# Patient Record
Sex: Male | Born: 1980
Health system: Southern US, Community
[De-identification: ages and names within clinical notes are randomized; demographics above are authoritative.]

## PROBLEM LIST (undated history)

## (undated) DIAGNOSIS — F4310 Post-traumatic stress disorder, unspecified: Secondary | ICD-10-CM

## (undated) DIAGNOSIS — F319 Bipolar disorder, unspecified: Secondary | ICD-10-CM

## (undated) DIAGNOSIS — Z720 Tobacco use: Secondary | ICD-10-CM

## (undated) HISTORY — PX: ORTHOPEDIC SURGERY: SHX850

---

## 2003-11-27 ENCOUNTER — Other Ambulatory Visit: Payer: Self-pay

## 2003-11-27 ENCOUNTER — Emergency Department: Payer: Self-pay | Admitting: Emergency Medicine

## 2004-03-12 ENCOUNTER — Emergency Department: Payer: Self-pay | Admitting: General Practice

## 2004-10-14 ENCOUNTER — Emergency Department: Payer: Self-pay | Admitting: Emergency Medicine

## 2006-10-14 ENCOUNTER — Emergency Department: Payer: Self-pay | Admitting: Unknown Physician Specialty

## 2007-08-25 ENCOUNTER — Emergency Department: Payer: Self-pay | Admitting: Emergency Medicine

## 2007-08-31 ENCOUNTER — Emergency Department: Payer: Self-pay | Admitting: Emergency Medicine

## 2009-03-15 ENCOUNTER — Emergency Department: Payer: Self-pay | Admitting: Emergency Medicine

## 2009-04-20 ENCOUNTER — Observation Stay: Payer: Self-pay | Admitting: Specialist

## 2010-09-17 ENCOUNTER — Emergency Department: Payer: Self-pay | Admitting: *Deleted

## 2011-01-04 ENCOUNTER — Emergency Department: Payer: Self-pay | Admitting: Unknown Physician Specialty

## 2012-07-06 ENCOUNTER — Emergency Department: Payer: Self-pay | Admitting: Emergency Medicine

## 2014-02-18 ENCOUNTER — Emergency Department: Payer: Self-pay | Admitting: Emergency Medicine

## 2014-02-18 LAB — COMPREHENSIVE METABOLIC PANEL
ANION GAP: 3 — AB (ref 7–16)
Albumin: 3.7 g/dL (ref 3.4–5.0)
Alkaline Phosphatase: 57 U/L (ref 46–116)
BUN: 15 mg/dL (ref 7–18)
Bilirubin,Total: 0.4 mg/dL (ref 0.2–1.0)
CALCIUM: 8.8 mg/dL (ref 8.5–10.1)
CHLORIDE: 105 mmol/L (ref 98–107)
CO2: 29 mmol/L (ref 21–32)
CREATININE: 0.9 mg/dL (ref 0.60–1.30)
EGFR (African American): >60
EGFR (Non-African Amer.): >60
GLUCOSE: 124 mg/dL — AB (ref 65–99)
OSMOLALITY: 276 (ref 275–301)
POTASSIUM: 3.9 mmol/L (ref 3.5–5.1)
SGOT(AST): 31 U/L (ref 15–37)
SGPT (ALT): 28 U/L (ref 14–63)
SODIUM: 137 mmol/L (ref 136–145)
Total Protein: 7.1 g/dL (ref 6.4–8.2)

## 2014-02-18 LAB — CBC WITH DIFFERENTIAL/PLATELET
BASOS ABS: 0.1 10*3/uL (ref 0.0–0.1)
BASOS PCT: 0.9 %
Eosinophil #: 0.1 10*3/uL (ref 0.0–0.7)
Eosinophil %: 0.9 %
HCT: 45.4 % (ref 40.0–52.0)
HGB: 14.9 g/dL (ref 13.0–18.0)
LYMPHS ABS: 2.9 10*3/uL (ref 1.0–3.6)
Lymphocyte %: 19 %
MCH: 30.4 pg (ref 26.0–34.0)
MCHC: 32.9 g/dL (ref 32.0–36.0)
MCV: 92 fL (ref 80–100)
MONO ABS: 1.2 x10 3/mm — AB (ref 0.2–1.0)
Monocyte %: 7.7 %
NEUTROS ABS: 10.8 10*3/uL — AB (ref 1.4–6.5)
NEUTROS PCT: 71.5 %
PLATELETS: 196 10*3/uL (ref 150–440)
RBC: 4.92 10*6/uL (ref 4.40–5.90)
RDW: 13.9 % (ref 11.5–14.5)
WBC: 15.1 10*3/uL — AB (ref 3.8–10.6)

## 2014-02-18 LAB — LIPASE, BLOOD: Lipase: 156 U/L (ref 73–393)

## 2014-02-18 LAB — TROPONIN I: Troponin-I: <0.02 ng/mL

## 2014-02-23 ENCOUNTER — Emergency Department: Payer: Self-pay | Admitting: Emergency Medicine

## 2016-02-23 ENCOUNTER — Emergency Department
Admission: EM | Admit: 2016-02-23 | Discharge: 2016-02-24 | Disposition: A | Discharge status: Home / Self Care | Payer: Medicare Other | Attending: Emergency Medicine | Admitting: Emergency Medicine

## 2016-02-23 DIAGNOSIS — N200 Calculus of kidney: Secondary | ICD-10-CM

## 2016-02-23 DIAGNOSIS — R109 Unspecified abdominal pain: Secondary | ICD-10-CM | POA: Diagnosis present

## 2016-02-23 LAB — BASIC METABOLIC PANEL
Anion gap: 10 (ref 5–15)
BUN: 13 mg/dL (ref 6–20)
CHLORIDE: 103 mmol/L (ref 101–111)
CO2: 24 mmol/L (ref 22–32)
Calcium: 9 mg/dL (ref 8.9–10.3)
Creatinine, Ser: 0.91 mg/dL (ref 0.61–1.24)
GFR calc Af Amer: >60 mL/min (ref >60)
GFR calc non Af Amer: >60 mL/min (ref >60)
GLUCOSE: 146 mg/dL — AB (ref 65–99)
POTASSIUM: 4.1 mmol/L (ref 3.5–5.1)
Sodium: 137 mmol/L (ref 135–145)

## 2016-02-23 LAB — CBC
HEMATOCRIT: 44.1 % (ref 40.0–52.0)
Hemoglobin: 15.4 g/dL (ref 13.0–18.0)
MCH: 31.2 pg (ref 26.0–34.0)
MCHC: 34.9 g/dL (ref 32.0–36.0)
MCV: 89.3 fL (ref 80.0–100.0)
Platelets: 222 10*3/uL (ref 150–440)
RBC: 4.94 MIL/uL (ref 4.40–5.90)
RDW: 13.8 % (ref 11.5–14.5)
WBC: 16.8 10*3/uL — ABNORMAL HIGH (ref 3.8–10.6)

## 2016-02-23 MED ORDER — SODIUM CHLORIDE 0.9 % IV BOLUS (SEPSIS)
1000.0000 mL | Freq: Once | INTRAVENOUS | Status: AC
  Administered 2016-02-23: 1000 mL via INTRAVENOUS

## 2016-02-23 MED ORDER — MORPHINE SULFATE (PF) 4 MG/ML IV SOLN
4.0000 mg | Freq: Once | INTRAVENOUS | Status: AC
  Administered 2016-02-23: 4 mg via INTRAVENOUS
  Filled 2016-02-23: qty 1

## 2016-02-23 MED ORDER — ONDANSETRON HCL 4 MG/2ML IJ SOLN
4.0000 mg | Freq: Once | INTRAMUSCULAR | Status: AC
  Administered 2016-02-23: 4 mg via INTRAVENOUS
  Filled 2016-02-23: qty 2

## 2016-02-23 NOTE — ED Provider Notes (Signed)
[facility] Emergency Department Provider Note  ____________________________________________   I have reviewed the triage vital signs and the nursing notes.   HISTORY  Chief Complaint Right flank pain  History limited by: Not Limited   HPI David Burch is a 36 y.o. male who presents to the emergency department today because of concern for right flank pain. The pain started tonight. It started suddenly. It is severe. Has been associated with nausea and shortness of breath. The patient has not urinated since the pain started. States he had pain similar to this once in the past and was told that they thought he had a kidney stone.   No past medical history on file.  There are no active problems to display for this patient.   No past surgical history on file.  Prior to Admission medications   Not on File    Allergies Patient has no known allergies.  No family history on file.  Social History Social History  Substance Use Topics  . Smoking status: Not on file  . Smokeless tobacco: Not on file  . Alcohol use Not on file    Review of Systems  Constitutional: Negative for fever. Cardiovascular: Negative for chest pain. Respiratory: Negative for shortness of breath. Gastrointestinal: Positive for right flank pain, positive for nausea.  Genitourinary: Negative for dysuria. Musculoskeletal: Negative for back pain. Skin: Negative for rash. Neurological: Negative for headaches, focal weakness or numbness.  10-point ROS otherwise negative.  ____________________________________________   PHYSICAL EXAM:  VITAL SIGNS: ED Triage Vitals  Enc Vitals Group     BP 02/23/16 2042 (!) 174/78     Pulse Rate 02/23/16 2226 (!) 54     Resp --      Temp --      Temp src --      SpO2 02/23/16 2226 96 %     Weight 02/23/16 2038 215 lb (97.5 kg)     Height 02/23/16 2038 5\' 7"  (1.702 m)     Head Circumference --      Peak Flow --      Pain Score  02/23/16 2222 10   Constitutional: Alert and oriented. Appears uncomfortable.  Eyes: Conjunctivae are normal. Normal extraocular movements. ENT   Head: Normocephalic and atraumatic.   Nose: No congestion/rhinnorhea.   Mouth/Throat: Mucous membranes are moist.   Neck: No stridor. Hematological/Lymphatic/Immunilogical: No cervical lymphadenopathy. Cardiovascular: Normal rate, regular rhythm.  No murmurs, rubs, or gallops.  Respiratory: Normal respiratory effort without tachypnea nor retractions. Breath sounds are clear and equal bilaterally. No wheezes/rales/rhonchi. Gastrointestinal: Soft and non tender. No rebound. No guarding.  Genitourinary: Deferred Musculoskeletal: Normal range of motion in all extremities. No lower extremity edema. Neurologic:  Normal speech and language. No gross focal neurologic deficits are appreciated.  Skin:  Skin is warm, dry and intact. No rash noted. Psychiatric: Mood and affect are normal. Speech and behavior are normal. Patient exhibits appropriate insight and judgment.  ____________________________________________    LABS (pertinent positives/negatives)  Labs Reviewed  BASIC METABOLIC PANEL - Abnormal; Notable for the following:       Result Value   Glucose, Bld 146 (*)    All other components within normal limits  CBC - Abnormal; Notable for the following:    WBC 16.8 (*)    All other components within normal limits  URINALYSIS, COMPLETE (UACMP) WITH MICROSCOPIC   UA pending  ____________________________________________   EKG  None  ____________________________________________    RADIOLOGY  None  ____________________________________________  PROCEDURES  Procedures  ____________________________________________   INITIAL IMPRESSION / ASSESSMENT AND PLAN / ED COURSE  Pertinent labs & imaging results that were available during my care of the patient were reviewed by me and considered in my medical decision making  (see chart for details).  Patient comes in because of concerns for right flank pain. Clinical history is concerning for kidney stones. UA still pending at time of sign out.  ____________________________________________   FINAL CLINICAL IMPRESSION(S) / ED DIAGNOSES  Right flank pain  Note: This dictation was prepared with Dragon dictation. Any transcriptional errors that result from this process are unintentional     Phineas Semen, MD 02/24/16 0010

## 2016-02-23 NOTE — ED Triage Notes (Addendum)
Patient reports sudden onset of back pain and feeling short of breath.  Patient denies any type of injury.  Patient points to right flank pain

## 2016-02-23 NOTE — ED Notes (Addendum)
Patient states he is unable to void at this time. Patient ambulated to the room with a steady gait.

## 2016-02-23 NOTE — ED Notes (Addendum)
Pt unable to give urine sample at this time. Pt has specimen cup. Pt pacing back and forth in triage room in pain. Stated he is sweaty too.

## 2016-02-24 ENCOUNTER — Emergency Department: Payer: Medicare Other

## 2016-02-24 ENCOUNTER — Encounter: Payer: Self-pay | Admitting: Radiology

## 2016-02-24 DIAGNOSIS — N200 Calculus of kidney: Secondary | ICD-10-CM | POA: Diagnosis not present

## 2016-02-24 LAB — URINALYSIS, COMPLETE (UACMP) WITH MICROSCOPIC
Bacteria, UA: NONE SEEN
Bilirubin Urine: NEGATIVE
GLUCOSE, UA: NEGATIVE mg/dL
Ketones, ur: 20 mg/dL — AB
Leukocytes, UA: NEGATIVE
NITRITE: NEGATIVE
PH: 5 (ref 5.0–8.0)
Protein, ur: 30 mg/dL — AB
Specific Gravity, Urine: 1.028 (ref 1.005–1.030)

## 2016-02-24 MED ORDER — MORPHINE SULFATE (PF) 4 MG/ML IV SOLN
4.0000 mg | Freq: Once | INTRAVENOUS | Status: AC
  Administered 2016-02-24: 4 mg via INTRAVENOUS
  Filled 2016-02-24: qty 1

## 2016-02-24 MED ORDER — OXYCODONE-ACETAMINOPHEN 5-325 MG PO TABS
2.0000 | ORAL_TABLET | Freq: Once | ORAL | Status: AC
  Administered 2016-02-24: 2 via ORAL
  Filled 2016-02-24: qty 2

## 2016-02-24 MED ORDER — IOPAMIDOL (ISOVUE-300) INJECTION 61%
100.0000 mL | Freq: Once | INTRAVENOUS | Status: AC | PRN
  Administered 2016-02-24: 100 mL via INTRAVENOUS

## 2016-02-24 MED ORDER — ONDANSETRON HCL 4 MG/2ML IJ SOLN
4.0000 mg | INTRAMUSCULAR | Status: AC
  Administered 2016-02-24: 4 mg via INTRAVENOUS
  Filled 2016-02-24: qty 2

## 2016-02-24 MED ORDER — ONDANSETRON 4 MG PO TBDP: ORAL_TABLET | ORAL | 0 refills | Status: DC

## 2016-02-24 MED ORDER — IOPAMIDOL (ISOVUE-300) INJECTION 61%
30.0000 mL | Freq: Once | INTRAVENOUS | Status: AC
  Administered 2016-02-24: 30 mL via ORAL

## 2016-02-24 MED ORDER — KETOROLAC TROMETHAMINE 30 MG/ML IJ SOLN
15.0000 mg | Freq: Once | INTRAMUSCULAR | Status: AC
  Administered 2016-02-24: 15 mg via INTRAVENOUS
  Filled 2016-02-24: qty 1

## 2016-02-24 MED ORDER — HALOPERIDOL LACTATE 5 MG/ML IJ SOLN
1.0000 mg | Freq: Once | INTRAMUSCULAR | Status: AC
  Administered 2016-02-24: 1 mg via INTRAVENOUS
  Filled 2016-02-24: qty 1

## 2016-02-24 MED ORDER — TAMSULOSIN HCL 0.4 MG PO CAPS: ORAL_CAPSULE | ORAL | 0 refills | Status: AC

## 2016-02-24 MED ORDER — OXYCODONE-ACETAMINOPHEN 5-325 MG PO TABS: 1.0000 | ORAL_TABLET | ORAL | 0 refills | Status: DC | PRN

## 2016-02-24 MED ORDER — HYDROMORPHONE HCL 1 MG/ML IJ SOLN
1.0000 mg | INTRAMUSCULAR | Status: AC
  Administered 2016-02-24: 1 mg via INTRAVENOUS
  Filled 2016-02-24: qty 1

## 2016-02-24 NOTE — ED Notes (Signed)
Pt. Transported to CT at this time.  

## 2016-02-24 NOTE — Discharge Instructions (Signed)
You have been seen in the Emergency Department (ED) today for pain that we believe based on your workup, is caused by kidney stones.  As we have discussed, please drink plenty of fluids.  Please make a follow up appointment with the physician(s) listed elsewhere in this documentation. ° °You may take pain medication as needed but ONLY as prescribed.  Please also take your prescribed Flomax daily.  We also recommend that you take over-the-counter ibuprofen regularly according to label instructions over the next 5 days.  Take it with meals to minimize stomach discomfort. ° °Please see your doctor as soon as possible as stones may take 1-3 weeks to pass and you may require additional care or medications. ° °Do not drink alcohol, drive or participate in any other potentially dangerous activities while taking opiate pain medication as it may make you sleepy. Do not take this medication with any other sedating medications, either prescription or over-the-counter. If you were prescribed Percocet or Vicodin, do not take these with acetaminophen (Tylenol) as it is already contained within these medications. °  °Take Percocet as needed for severe pain.  This medication is an opiate (or narcotic) pain medication and can be habit forming.  Use it as little as possible to achieve adequate pain control.  Do not use or use it with extreme caution if you have a history of opiate abuse or dependence.  If you are on a pain contract with your primary care doctor or a pain specialist, be sure to let them know you were prescribed this medication today from the [facility].  This medication is intended for your use only - do not give any to anyone else and keep it in a secure place where nobody else, especially children, have access to it.  It will also cause or worsen constipation, so you may want to consider taking an over-the-counter stool softener while you are taking this medication. ° °Return to the  Emergency Department (ED) or call your doctor if you have any worsening pain, fever, painful urination, are unable to urinate, or develop other symptoms that concern you. ° °

## 2016-02-24 NOTE — ED Provider Notes (Signed)
Clinical Course as of Feb 24 816  Wynelle LinkSun Feb 24, 2016  0017 Assuming care from Dr. Derrill KayGoodman. Awaiting urine to determine CT renal stone protocol vs CT abd/pelvis with PO and IV contrast.  [CF]  0112 Urinalysis without gross hematuria.  Will evaluate with CT abd/pelvis with PO and IV contrast.  [CF]  0229 Right UVJ stone with "marked" hydronephrosis.  Will discuss with patient.  [CF]  0236 The patient agrees with the plan for pain management and urology follow-up.  I explained about a ureter stent and he agrees he does not want one of those at this time.  There is no evidence of infection on his urinalysis so I will not start him on antibiotics.I gave my usual and customary return precautions.   [CF]  [id number] I reviewed the patient's prescription history over the last 12 months in the [place] Controlled Substances Database, and he has not had any controlled substances filled in that time.  [CF]  0250 Haldol 1 mg IV for refractory nausea.  No need for Benadryl given very low dose and low risk of side effects.  [CF]    Clinical Course User Index [CF] Loleta Roseory Alcario Tinkey, MD      Loleta Roseory Emry Tobin, MD 02/24/16 [phone]

## 2016-02-24 NOTE — ED Notes (Signed)
Pt. Returned to tx. room in stable condition with no acute changes since departure from unit for scans.   

## 2016-02-24 NOTE — ED Notes (Signed)
Pt tolerated 1 bottle oral contrast. CT contacted and states will soon take pt to scan

## 2016-02-24 NOTE — ED Notes (Signed)

## 2016-02-24 NOTE — ED Notes (Signed)
MD York CeriseForbach at bedside at bedside at this time to discuss care plan.

## 2016-08-20 DIAGNOSIS — Z23 Encounter for immunization: Secondary | ICD-10-CM | POA: Diagnosis not present

## 2016-10-02 ENCOUNTER — Emergency Department: Payer: Medicare Other

## 2016-10-02 ENCOUNTER — Emergency Department
Admission: EM | Admit: 2016-10-02 | Discharge: 2016-10-02 | Disposition: A | Discharge status: Home / Self Care | Payer: Medicare Other | Attending: Emergency Medicine | Admitting: Emergency Medicine

## 2016-10-02 DIAGNOSIS — S0093XA Contusion of unspecified part of head, initial encounter: Secondary | ICD-10-CM | POA: Insufficient documentation

## 2016-10-02 DIAGNOSIS — Y999 Unspecified external cause status: Secondary | ICD-10-CM | POA: Diagnosis not present

## 2016-10-02 DIAGNOSIS — Y939 Activity, unspecified: Secondary | ICD-10-CM | POA: Diagnosis not present

## 2016-10-02 DIAGNOSIS — Y929 Unspecified place or not applicable: Secondary | ICD-10-CM | POA: Diagnosis not present

## 2016-10-02 DIAGNOSIS — S0083XA Contusion of other part of head, initial encounter: Secondary | ICD-10-CM | POA: Diagnosis not present

## 2016-10-02 DIAGNOSIS — M542 Cervicalgia: Secondary | ICD-10-CM | POA: Diagnosis not present

## 2016-10-02 DIAGNOSIS — R51 Headache: Secondary | ICD-10-CM | POA: Diagnosis not present

## 2016-10-02 DIAGNOSIS — S0990XA Unspecified injury of head, initial encounter: Secondary | ICD-10-CM | POA: Diagnosis present

## 2016-10-02 MED ORDER — CYCLOBENZAPRINE HCL 10 MG PO TABS: 10.0000 mg | ORAL_TABLET | Freq: Three times a day (TID) | ORAL | 0 refills | Status: AC | PRN

## 2016-10-02 MED ORDER — ORPHENADRINE CITRATE 30 MG/ML IJ SOLN
60.0000 mg | Freq: Two times a day (BID) | INTRAMUSCULAR | Status: DC
  Administered 2016-10-02: 60 mg via INTRAMUSCULAR
  Filled 2016-10-02: qty 2

## 2016-10-02 MED ORDER — MELOXICAM 15 MG PO TABS: 15.0000 mg | ORAL_TABLET | Freq: Every day | ORAL | 0 refills | Status: AC

## 2016-10-02 MED ORDER — KETOROLAC TROMETHAMINE 30 MG/ML IJ SOLN
30.0000 mg | Freq: Once | INTRAMUSCULAR | Status: DC
  Filled 2016-10-02: qty 1
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
## 2016-10-02 NOTE — ED Notes (Signed)
Pt states taking tylenol PTA, no pain relief.

## 2016-10-02 NOTE — ED Notes (Signed)
Patient refused discharge vital signs. 

## 2016-10-02 NOTE — ED Notes (Signed)
Pt returned from xr
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
## 2018-01-15 ENCOUNTER — Emergency Department: Payer: Medicare Other

## 2018-01-15 ENCOUNTER — Encounter: Payer: Self-pay | Admitting: Emergency Medicine

## 2018-01-15 ENCOUNTER — Other Ambulatory Visit: Payer: Self-pay

## 2018-01-15 ENCOUNTER — Emergency Department
Admission: EM | Admit: 2018-01-15 | Discharge: 2018-01-15 | Disposition: A | Discharge status: Home / Self Care | Payer: Medicare Other | Attending: Emergency Medicine | Admitting: Emergency Medicine

## 2018-01-15 DIAGNOSIS — F17210 Nicotine dependence, cigarettes, uncomplicated: Secondary | ICD-10-CM | POA: Insufficient documentation

## 2018-01-15 DIAGNOSIS — M545 Low back pain: Secondary | ICD-10-CM | POA: Diagnosis not present

## 2018-01-15 DIAGNOSIS — R8290 Unspecified abnormal findings in urine: Secondary | ICD-10-CM | POA: Diagnosis not present

## 2018-01-15 DIAGNOSIS — S3991XA Unspecified injury of abdomen, initial encounter: Secondary | ICD-10-CM | POA: Diagnosis not present

## 2018-01-15 DIAGNOSIS — Y929 Unspecified place or not applicable: Secondary | ICD-10-CM | POA: Diagnosis not present

## 2018-01-15 DIAGNOSIS — Y998 Other external cause status: Secondary | ICD-10-CM | POA: Insufficient documentation

## 2018-01-15 DIAGNOSIS — R109 Unspecified abdominal pain: Secondary | ICD-10-CM | POA: Diagnosis not present

## 2018-01-15 DIAGNOSIS — Y9389 Activity, other specified: Secondary | ICD-10-CM | POA: Insufficient documentation

## 2018-01-15 DIAGNOSIS — N3000 Acute cystitis without hematuria: Secondary | ICD-10-CM | POA: Diagnosis not present

## 2018-01-15 DIAGNOSIS — N3090 Cystitis, unspecified without hematuria: Secondary | ICD-10-CM | POA: Diagnosis not present

## 2018-01-15 DIAGNOSIS — S3992XA Unspecified injury of lower back, initial encounter: Secondary | ICD-10-CM | POA: Diagnosis not present

## 2018-01-15 HISTORY — DX: Bipolar disorder, unspecified: F31.9

## 2018-01-15 HISTORY — DX: Post-traumatic stress disorder, unspecified: F43.10

## 2018-01-15 LAB — URINALYSIS, COMPLETE (UACMP) WITH MICROSCOPIC
Bacteria, UA: NONE SEEN
Bilirubin Urine: NEGATIVE
Glucose, UA: NEGATIVE mg/dL
KETONES UR: NEGATIVE mg/dL
Leukocytes, UA: NEGATIVE
Nitrite: NEGATIVE
PH: 5 (ref 5.0–8.0)
Protein, ur: NEGATIVE mg/dL
SPECIFIC GRAVITY, URINE: 1.013 (ref 1.005–1.030)
SQUAMOUS EPITHELIAL / LPF: NONE SEEN (ref 0–5)

## 2018-01-15 MED ORDER — OXYCODONE-ACETAMINOPHEN 7.5-325 MG PO TABS: 1.0000 | ORAL_TABLET | Freq: Four times a day (QID) | ORAL | 0 refills | Status: DC | PRN

## 2018-01-15 MED ORDER — IBUPROFEN 600 MG PO TABS: 600.0000 mg | ORAL_TABLET | Freq: Three times a day (TID) | ORAL | 0 refills | Status: DC | PRN

## 2018-01-15 MED ORDER — KETOROLAC TROMETHAMINE 60 MG/2ML IM SOLN
60.0000 mg | Freq: Once | INTRAMUSCULAR | Status: AC
Start: 2018-01-15 — End: 2018-01-15
  Administered 2018-01-15: 60 mg via INTRAMUSCULAR
  Filled 2018-01-15: qty 2

## 2018-01-15 MED ORDER — SULFAMETHOXAZOLE-TRIMETHOPRIM 800-160 MG PO TABS: 1.0000 | ORAL_TABLET | Freq: Two times a day (BID) | ORAL | 0 refills | Status: DC

## 2018-01-15 MED ORDER — CYCLOBENZAPRINE HCL 10 MG PO TABS: 10.0000 mg | ORAL_TABLET | Freq: Three times a day (TID) | ORAL | 0 refills | Status: DC | PRN
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
## 2018-01-15 NOTE — Discharge Instructions (Signed)
Follow discharge care instruction take medication as directed.  If no improvement in cystitis follow-up with urologist on your discharge care instructions.
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
## 2020-06-27 ENCOUNTER — Encounter: Payer: Self-pay | Admitting: Emergency Medicine

## 2020-06-27 ENCOUNTER — Other Ambulatory Visit: Payer: Self-pay

## 2020-06-27 ENCOUNTER — Emergency Department
Admission: EM | Admit: 2020-06-27 | Discharge: 2020-06-27 | Disposition: A | Discharge status: Home / Self Care | Payer: Medicare Other | Attending: Emergency Medicine | Admitting: Emergency Medicine

## 2020-06-27 DIAGNOSIS — F17210 Nicotine dependence, cigarettes, uncomplicated: Secondary | ICD-10-CM | POA: Insufficient documentation

## 2020-06-27 DIAGNOSIS — R519 Headache, unspecified: Secondary | ICD-10-CM | POA: Insufficient documentation

## 2020-06-27 DIAGNOSIS — Z5321 Procedure and treatment not carried out due to patient leaving prior to being seen by health care provider: Secondary | ICD-10-CM | POA: Insufficient documentation

## 2020-06-27 DIAGNOSIS — H5713 Ocular pain, bilateral: Secondary | ICD-10-CM | POA: Insufficient documentation

## 2020-06-27 MED ORDER — SODIUM CHLORIDE 0.9 % IV BOLUS: 1000.0000 mL | Freq: Once | INTRAVENOUS | Status: DC

## 2020-06-27 NOTE — ED Provider Notes (Signed)
[facility] Emergency Department Provider Note  ____________________________________________  Time seen: Approximately 5:35 PM  I have reviewed the triage vital signs and the nursing notes.   HISTORY  Chief Complaint Headache, Blurred Vision, and Facial Pain    HPI David Burch is a 40 y.o. male who presents the emergency department complaining of bilateral eye pain.  Patient states that he has had symptoms for 2 days.  He states that yesterday he thought that he may be having some allergic reaction as he was outside fishing with his kids.  He taken some over-the-counter allergy meds and then awoke this morning with significant periorbital edema and erythema as well as subconjunctival hemorrhage.  No visual changes.  He states that the areas are painful.  No headache, fevers or chills, nasal congestion, sore throat, cough.  Other than allergy medicine no medicines for this complaint prior to arrival.         Past Medical History:  Diagnosis Date  . Bipolar 1 disorder (HCC)   . PTSD (post-traumatic stress disorder)     There are no problems to display for this patient.   Past Surgical History:  Procedure Laterality Date  . ORTHOPEDIC SURGERY      Prior to Admission medications   Medication Sig Start Date End Date Taking? Authorizing Provider  cyclobenzaprine (FLEXERIL) 10 MG tablet Take 1 tablet (10 mg total) by mouth 3 (three) times daily as needed. 01/15/18   Joni Reining, PA-C  ibuprofen (ADVIL,MOTRIN) 600 MG tablet Take 1 tablet (600 mg total) by mouth every 8 (eight) hours as needed. 01/15/18   Joni Reining, PA-C  ondansetron (ZOFRAN ODT) 4 MG disintegrating tablet Allow 1-2 tablets to dissolve in your mouth every 8 hours as needed for nausea/vomiting 02/24/16   Loleta Rose, MD  ondansetron (ZOFRAN ODT) 4 MG disintegrating tablet Allow 1-2 tablets to dissolve in your mouth every 8 hours as needed for nausea/vomiting 02/24/16   Loleta Rose,  MD  oxyCODONE-acetaminophen (PERCOCET) 7.5-325 MG tablet Take 1 tablet by mouth every 6 (six) hours as needed for severe pain. 01/15/18   Joni Reining, PA-C  oxyCODONE-acetaminophen (ROXICET) 5-325 MG tablet Take 1-2 tablets by mouth every 4 (four) hours as needed for severe pain. 02/24/16   Loleta Rose, MD  sulfamethoxazole-trimethoprim (BACTRIM DS,SEPTRA DS) 800-160 MG tablet Take 1 tablet by mouth 2 (two) times daily. 01/15/18   Joni Reining, PA-C  tamsulosin [facility]) 0.4 MG CAPS capsule Take 1 tablet by mouth daily until you pass the kidney stone or no longer have symptoms 02/24/16   Loleta Rose, MD    Allergies Patient has no known allergies.  History reviewed. No pertinent family history.  Social History Social History   Tobacco Use  . Smoking status: Current Every Day Smoker    Packs/day: 0.50  . Smokeless tobacco: Former Clinical biochemist  . Vaping Use: Former  Substance Use Topics  . Alcohol use: Not Currently  . Drug use: Not Currently     Review of Systems  Constitutional: No fever/chills Eyes: No visual changes. No discharge.  Positive for bilateral periorbital edema and subconjunctival hemorrhage ENT: No upper respiratory complaints. Cardiovascular: no chest pain. Respiratory: no cough. No SOB. Gastrointestinal: No abdominal pain.  No nausea, no vomiting.  No diarrhea.  No constipation. Musculoskeletal: Negative for musculoskeletal pain. Skin: Negative for rash, abrasions, lacerations, ecchymosis. Neurological: Negative for headaches, focal weakness or numbness.  10 System ROS otherwise negative.  ____________________________________________  PHYSICAL EXAM:  VITAL SIGNS: ED Triage Vitals  Enc Vitals Group     BP 06/27/20 1500 (!) 152/95     Pulse Rate 06/27/20 1500 70     Resp 06/27/20 1500 18     Temp 06/27/20 1500 98.5 F (36.9 C)     Temp Source 06/27/20 1500 Oral     SpO2 06/27/20 1500 98 %     Weight 06/27/20 1500 235 lb (106.6 kg)      Height 06/27/20 1500 5\' 7"  (1.702 m)     Head Circumference --      Peak Flow --      Pain Score 06/27/20 1459 10     Pain Loc --      Pain Edu? --      Excl. in GC? --      Constitutional: Alert and oriented. Well appearing and in no acute distress. Eyes: Conjunctivae are normal. PERRL. EOMI. patient has evidence of subconjunctival hemorrhage bilaterally.  Funduscopic exam reveals red reflex bilaterally.  Vasculature and optic disc is unremarkable bilaterally.  Visual acuity on physical exam is intact.  Patient has periorbital erythema and edema.  This is very erythematous and very well demarcated.  Patient has tenderness over these areas bilateral eyelids both superior and inferior eyelid bilaterally. Head: Atraumatic. ENT:      Ears:       Nose: No congestion/rhinnorhea.      Mouth/Throat: Mucous membranes are moist.  Neck: No stridor.    Cardiovascular: Normal rate, regular rhythm. Normal S1 and S2.  Good peripheral circulation. Respiratory: Normal respiratory effort without tachypnea or retractions. Lungs CTAB. Good air entry to the bases with no decreased or absent breath sounds. Musculoskeletal: Full range of motion to all extremities. No gross deformities appreciated. Neurologic:  Normal speech and language. No gross focal neurologic deficits are appreciated.  Skin:  Skin is warm, dry and intact. No rash noted. Psychiatric: Mood and affect are normal. Speech and behavior are normal. Patient exhibits appropriate insight and judgement.   ____________________________________________   LABS (all labs ordered are listed, but only abnormal results are displayed)  Labs Reviewed  CULTURE, BLOOD (ROUTINE X 2)  CULTURE, BLOOD (ROUTINE X 2)  COMPREHENSIVE METABOLIC PANEL  CBC WITH DIFFERENTIAL/PLATELET  LACTIC ACID, PLASMA  LACTIC ACID, PLASMA   ____________________________________________  EKG   ____________________________________________  RADIOLOGY   No results  found.  ____________________________________________    PROCEDURES  Procedure(s) performed:    Procedures    Medications  sodium chloride 0.9 % bolus 1,000 mL (has no administration in time range)     ____________________________________________   INITIAL IMPRESSION / ASSESSMENT AND PLAN / ED COURSE  Pertinent labs & imaging results that were available during my care of the patient were reviewed by me and considered in my medical decision making (see chart for details).  Review of the [place] CSRS was performed in accordance of the [facility] prior to dispensing any controlled drugs.          Patient apparently eloped prior to final diagnosis and disposition.  Patient presented to the emergency department for bilateral periorbital erythema, edema and pain.  I evaluated the patient, and lead differential included periorbital cellulitis versus preseptal cellulitis versus erysipelas.  At this time labs and imaging were ordered.  Patient states that he has 4 kids at home, he had already stated that he would not be admitted and was not overly agreeable to the length of time needed to perform imaging and labs.  When staff entered the room to place IV, draw labs, patient had apparently eloped without informing any of the staff or myself.  Patient apparently has eloped at this time.  Patient has not received the final diagnosis or disposition at this time.   This chart was dictated using voice recognition software/Dragon. Despite best efforts to proofread, errors can occur which can change the meaning. Any change was purely unintentional.    Racheal Patches, PA-C 06/27/20 Hayden Rasmussen, MD 06/28/20 1620

## 2020-06-27 NOTE — ED Triage Notes (Signed)
Pt comes into the ED via POV c/o headache, facial pain, and blurred vision. Pt presents with swelling to the eyelids and redness.  Pt admits he has had nasal congestion for the past couple days.  Pt in NAD at this time with even and unlabored respirations. Pt denies getting any known substances in his eyes.

## 2020-06-27 NOTE — ED Notes (Signed)
Patient not in room at this time.
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
## 2021-08-23 ENCOUNTER — Emergency Department: Payer: Medicare Other

## 2021-08-23 ENCOUNTER — Emergency Department
Admission: EM | Admit: 2021-08-23 | Discharge: 2021-08-23 | Disposition: A | Discharge status: Home / Self Care | Payer: Medicare Other | Attending: Emergency Medicine | Admitting: Emergency Medicine

## 2021-08-23 ENCOUNTER — Encounter: Payer: Self-pay | Admitting: Emergency Medicine

## 2021-08-23 ENCOUNTER — Telehealth: Payer: Self-pay

## 2021-08-23 DIAGNOSIS — R0789 Other chest pain: Secondary | ICD-10-CM | POA: Diagnosis not present

## 2021-08-23 DIAGNOSIS — R0602 Shortness of breath: Secondary | ICD-10-CM | POA: Diagnosis not present

## 2021-08-23 DIAGNOSIS — R0600 Dyspnea, unspecified: Secondary | ICD-10-CM | POA: Insufficient documentation

## 2021-08-23 DIAGNOSIS — K8020 Calculus of gallbladder without cholecystitis without obstruction: Secondary | ICD-10-CM

## 2021-08-23 DIAGNOSIS — K8070 Calculus of gallbladder and bile duct without cholecystitis without obstruction: Secondary | ICD-10-CM | POA: Diagnosis not present

## 2021-08-23 DIAGNOSIS — R072 Precordial pain: Secondary | ICD-10-CM | POA: Diagnosis not present

## 2021-08-23 DIAGNOSIS — K429 Umbilical hernia without obstruction or gangrene: Secondary | ICD-10-CM | POA: Diagnosis not present

## 2021-08-23 DIAGNOSIS — R079 Chest pain, unspecified: Secondary | ICD-10-CM | POA: Diagnosis not present

## 2021-08-23 DIAGNOSIS — K3189 Other diseases of stomach and duodenum: Secondary | ICD-10-CM | POA: Diagnosis not present

## 2021-08-23 DIAGNOSIS — R1110 Vomiting, unspecified: Secondary | ICD-10-CM | POA: Diagnosis not present

## 2021-08-23 DIAGNOSIS — R1013 Epigastric pain: Secondary | ICD-10-CM

## 2021-08-23 DIAGNOSIS — K6389 Other specified diseases of intestine: Secondary | ICD-10-CM | POA: Diagnosis not present

## 2021-08-23 DIAGNOSIS — R1011 Right upper quadrant pain: Secondary | ICD-10-CM | POA: Diagnosis not present

## 2021-08-23 DIAGNOSIS — R109 Unspecified abdominal pain: Secondary | ICD-10-CM | POA: Diagnosis present

## 2021-08-23 DIAGNOSIS — K8050 Calculus of bile duct without cholangitis or cholecystitis without obstruction: Secondary | ICD-10-CM | POA: Diagnosis not present

## 2021-08-23 LAB — CBC WITH DIFFERENTIAL/PLATELET
Abs Immature Granulocytes: 0.04 10*3/uL (ref 0.00–0.07)
Basophils Absolute: 0.1 10*3/uL (ref 0.0–0.1)
Basophils Relative: 1 %
Eosinophils Absolute: 0.4 10*3/uL (ref 0.0–0.5)
Eosinophils Relative: 3 %
HCT: 47.9 % (ref 39.0–52.0)
Hemoglobin: 15.8 g/dL (ref 13.0–17.0)
Immature Granulocytes: 0 %
Lymphocytes Relative: 25 %
Lymphs Abs: 3.4 10*3/uL (ref 0.7–4.0)
MCH: 30.8 pg (ref 26.0–34.0)
MCHC: 33 g/dL (ref 30.0–36.0)
MCV: 93.4 fL (ref 80.0–100.0)
Monocytes Absolute: 1.1 10*3/uL — ABNORMAL HIGH (ref 0.1–1.0)
Monocytes Relative: 8 %
Neutro Abs: 8.5 10*3/uL — ABNORMAL HIGH (ref 1.7–7.7)
Neutrophils Relative %: 63 %
Platelets: 238 10*3/uL (ref 150–400)
RBC: 5.13 MIL/uL (ref 4.22–5.81)
RDW: 13.6 % (ref 11.5–15.5)
WBC: 13.5 10*3/uL — ABNORMAL HIGH (ref 4.0–10.5)
nRBC: 0 % (ref 0.0–0.2)

## 2021-08-23 LAB — COMPREHENSIVE METABOLIC PANEL
ALT: 36 U/L (ref 0–44)
AST: 81 U/L — ABNORMAL HIGH (ref 15–41)
Albumin: 4.2 g/dL (ref 3.5–5.0)
Alkaline Phosphatase: 56 U/L (ref 38–126)
Anion gap: 7 (ref 5–15)
BUN: 10 mg/dL (ref 6–20)
CO2: 26 mmol/L (ref 22–32)
Calcium: 8.7 mg/dL — ABNORMAL LOW (ref 8.9–10.3)
Chloride: 106 mmol/L (ref 98–111)
Creatinine, Ser: 0.89 mg/dL (ref 0.61–1.24)
GFR, Estimated: >60 mL/min (ref >60)
Glucose, Bld: 134 mg/dL — ABNORMAL HIGH (ref 70–99)
Potassium: 3.7 mmol/L (ref 3.5–5.1)
Sodium: 139 mmol/L (ref 135–145)
Total Bilirubin: 0.6 mg/dL (ref 0.3–1.2)
Total Protein: 6.6 g/dL (ref 6.5–8.1)

## 2021-08-23 LAB — TROPONIN I (HIGH SENSITIVITY)
Troponin I (High Sensitivity): 5 ng/L (ref <18)
Troponin I (High Sensitivity): 4 ng/L (ref <18)

## 2021-08-23 LAB — LIPASE, BLOOD: Lipase: 48 U/L (ref 11–51)

## 2021-08-23 MED ORDER — LACTATED RINGERS IV BOLUS
1000.0000 mL | Freq: Once | INTRAVENOUS | Status: AC
  Administered 2021-08-23: 1000 mL via INTRAVENOUS

## 2021-08-23 MED ORDER — ONDANSETRON 4 MG PO TBDP: 4.0000 mg | ORAL_TABLET | Freq: Three times a day (TID) | ORAL | 0 refills | Status: DC | PRN

## 2021-08-23 MED ORDER — ONDANSETRON HCL 4 MG/2ML IJ SOLN
4.0000 mg | Freq: Once | INTRAMUSCULAR | Status: AC
  Administered 2021-08-23: 4 mg via INTRAVENOUS
  Filled 2021-08-23: qty 2

## 2021-08-23 MED ORDER — MORPHINE SULFATE (PF) 4 MG/ML IV SOLN
4.0000 mg | Freq: Once | INTRAVENOUS | Status: AC
  Administered 2021-08-23: 4 mg via INTRAVENOUS
  Filled 2021-08-23: qty 1

## 2021-08-23 MED ORDER — KETOROLAC TROMETHAMINE 30 MG/ML IJ SOLN
15.0000 mg | Freq: Once | INTRAMUSCULAR | Status: AC
  Administered 2021-08-23: 15 mg via INTRAVENOUS
  Filled 2021-08-23: qty 1

## 2021-08-23 NOTE — Telephone Encounter (Signed)
Unable to leave message. Call to patient to have him follow up with Dr Aleen Campi. This patient was seen early this morning in the ED and diagnosed with gallstones.

## 2021-08-23 NOTE — ED Provider Notes (Signed)
[facility] Provider Note    Event Date/Time   First MD Initiated Contact with Patient 08/23/21 0401     (approximate)   History   Shortness of Breath and Chest Pain   HPI  David Burch is a 41 y.o. male who presents to the ED for evaluation of Shortness of Breath and Chest Pain   Patient presents to the ED for evaluation of abdominal pain, dyspnea and emesis. He reports abdominal pain primarily, radiating around his right side to his mid/lower back. He is noted to be triaged as chest pain, but he denies any chest pain with me.   Reports the abd pain makes it feel like he cannot breath. Denies any cough, fever, dysuria.   Reports abd pain was mild and intermittent yesterday, but starting tonight in the past couple hours became more severe. One episode of emesis.   Physical Exam   Triage Vital Signs: ED Triage Vitals  Enc Vitals Group     BP 08/23/21 0349 128/77     Pulse Rate 08/23/21 0349 (!) 58     Resp 08/23/21 0349 18     Temp 08/23/21 0349 98.3 F (36.8 C)     Temp Source 08/23/21 0349 Oral     SpO2 08/23/21 0349 98 %     Weight 08/23/21 0347 238 lb 1.6 oz (108 kg)     Height 08/23/21 0347 5\' 7"  (1.702 m)     Head Circumference --      Peak Flow --      Pain Score 08/23/21 0347 10     Pain Loc --      Pain Edu? --      Excl. in [place]? --     Most recent vital signs: Vitals:   08/23/21 0530 08/23/21 0600  BP: (!) 140/74 95/63  Pulse: 75 (!) 56  Resp: 18 18  Temp:    SpO2: 98% 96%    General: Awake. Standing over the toilet when I enter the room, nonbloody nonbilious emesis in and around the toilet. Seems uncomfortabl CV:  Good peripheral perfusion.  Resp:  Normal effort.  Abd:  No distention. Poorly localizing TTP through the periumbilical and right sided abdomen without peritoneal features.  MSK:  No deformity noted.  Neuro:  No focal deficits appreciated. Other:     ED Results / Procedures / Treatments   Labs (all labs  ordered are listed, but only abnormal results are displayed) Labs Reviewed  COMPREHENSIVE METABOLIC PANEL - Abnormal; Notable for the following components:      Result Value   Glucose, Bld 134 (*)    Calcium 8.7 (*)    AST 81 (*)    All other components within normal limits  CBC WITH DIFFERENTIAL/PLATELET - Abnormal; Notable for the following components:   WBC 13.5 (*)    Neutro Abs 8.5 (*)    Monocytes Absolute 1.1 (*)    All other components within normal limits  LIPASE, BLOOD  URINALYSIS, ROUTINE W REFLEX MICROSCOPIC  TROPONIN I (HIGH SENSITIVITY)  TROPONIN I (HIGH SENSITIVITY)    EKG Sinus rhythm with a rate of 54 bpm.  Normal axis and intervals.  Upsloping ST elevations to inferior and lateral leads, likely benign early repolarization.  EKG from 2016 is quite similar.  RADIOLOGY CXR interpreted by me without evidence of acute cardiopulmonary pathology.  Official radiology report(s): 2017 ABDOMEN LIMITED RUQ (LIVER/GB)  Result Date: 08/23/2021 CLINICAL DATA:  Right upper quadrant pain for  3 hours with vomiting EXAM: ULTRASOUND ABDOMEN LIMITED RIGHT UPPER QUADRANT COMPARISON:  Today's CT FINDINGS: Gallbladder: Gallstones up to 3 mm. No wall thickening or pericholecystic fluid. Sonographic Murphy's sign was not elicited. Common bile duct: Diameter: Normal, 4 mm. Liver: No focal lesion identified. Within normal limits in parenchymal echogenicity. Portal vein is patent on color Doppler imaging with normal direction of blood flow towards the liver. Other: None. IMPRESSION: Cholelithiasis without acute cholecystitis or biliary duct dilatation. Electronically Signed   By: Jeronimo Greaves M.D.   On: 08/23/2021 06:30   CT Renal Stone Study  Result Date: 08/23/2021 CLINICAL DATA:  Flank pain.  Evaluate for kidney stone. EXAM: CT ABDOMEN AND PELVIS WITHOUT CONTRAST TECHNIQUE: Multidetector CT imaging of the abdomen and pelvis was performed following the standard protocol without IV contrast.  RADIATION DOSE REDUCTION: This exam was performed according to the departmental dose-optimization program which includes automated exposure control, adjustment of the mA and/or kV according to patient size and/or use of iterative reconstruction technique. COMPARISON:  01/15/2018 FINDINGS: Lower chest: No acute abnormality. Hepatobiliary: No focal liver abnormality is seen. No gallstones, gallbladder wall thickening, or biliary dilatation. Pancreas: Unremarkable. No pancreatic ductal dilatation or surrounding inflammatory changes. Spleen: Normal in size without focal abnormality. Adrenals/Urinary Tract: Normal appearance of the adrenal glands. No nephrolithiasis, hydronephrosis, or suspicious mass. No perinephric fat stranding identified. No signs of hydroureter or ureteral lithiasis. Urinary bladder appears normal. Stomach/Bowel: There is moderate distension of the gastric lumen. The appendix is visualized and appears within normal limits. There is no bowel wall thickening, inflammation, or distension. Vascular/Lymphatic: No significant vascular findings are present. No enlarged abdominal or pelvic lymph nodes. Reproductive: Prostate is unremarkable. Other: No free fluid or fluid collections. No signs of pneumoperitoneum.Fat containing umbilical hernia measures approximately 3 cm. Musculoskeletal: No acute or significant osseous findings. IMPRESSION: 1. No acute findings within the abdomen or pelvis. 2. Fat containing umbilical hernia. Electronically Signed   By: Signa Kell M.D.   On: 08/23/2021 05:24   DG Chest Port 1 View  Result Date: 08/23/2021 CLINICAL DATA:  Midsternal chest pain. EXAM: PORTABLE CHEST 1 VIEW COMPARISON:  03/12/2004, report only FINDINGS: Numerous leads and wires project over the chest. Midline trachea. Normal heart size. No pleural effusion or pneumothorax. Diffuse peribronchial thickening. Apical lordotic positioning. Clear lungs. IMPRESSION: 1. No acute cardiopulmonary disease. 2.  Mild peribronchial thickening which may relate to chronic bronchitis or smoking. Electronically Signed   By: Jeronimo Greaves M.D.   On: 08/23/2021 05:04    PROCEDURES and INTERVENTIONS:  Procedures  Medications  lactated ringers bolus 1,000 mL (0 mLs Intravenous Stopped 08/23/21 0624)  ondansetron (ZOFRAN) injection 4 mg (4 mg Intravenous Given 08/23/21 0442)  morphine (PF) 4 MG/ML injection 4 mg (4 mg Intravenous Given 08/23/21 0442)  ketorolac (TORADOL) 30 MG/ML injection 15 mg (15 mg Intravenous Given 08/23/21 0530)     IMPRESSION / MDM / ASSESSMENT AND PLAN / ED COURSE  I reviewed the triage vital signs and the nursing notes.  Differential diagnosis includes, but is not limited to, ACS, cholelithiasis, pancreatitis, choledocholithiasis, pneumothorax,  {Patient presents with symptoms of an acute illness or injury that is potentially life-threatening.  41 year old male presents to the ED with right-sided and epigastric abdominal pain, mostly postprandial, and evidence of symptomatic cholelithiasis suitable for outpatient management surgical follow-up.  CT scan results right-sided and upper abdominal tenderness without peritoneal features.  Mild leukocytosis is noted, but doubt infectious etiology of his symptoms.  Lipase is  normal and CMP without evidence of hepatobiliary obstruction.  Negative troponin and I doubt ACS.  CXR is clear.  No ureteral stones.  RUQ ultrasound confirms cholelithiasis without cholecystitis or stigmata of choledocholithiasis.  We will discharge with Zofran, surgical referral and return precautions for the ED.  Clinical Course as of 08/23/21 0653  Fri Aug 23, 2021  0488 Reassessed. Feeling much better after analgesia [DS]  0646 Reassessed.  Patient was feeling well.  Resolution of pain.  Tolerating p.o. without complications.  We discussed gallstones without evidence of cholecystitis.  Discussed managing this at home and return precautions for the ED.  Discussed surgical  evaluation in the clinic to discuss cholecystectomy.  He is agreeable. [DS]    Clinical Course User Index [DS] Delton Prairie, MD     FINAL CLINICAL IMPRESSION(S) / ED DIAGNOSES   Final diagnoses:  Calculus of gallbladder without cholecystitis without obstruction  Epigastric pain  Biliary colic     Rx / DC Orders   ED Discharge Orders          Ordered    ondansetron (ZOFRAN-ODT) 4 MG disintegrating tablet  Every 8 hours PRN        08/23/21 0647             Note:  This document was prepared using Dragon voice recognition software and may include unintentional dictation errors.   Delton Prairie, MD 08/23/21 [phone]

## 2021-08-23 NOTE — ED Triage Notes (Signed)
Pt presents via POV with complaints of mid sternal CP that radiates to his back that started an hour ago. Pt describes the pain as a tight cramp in the middle of his chest. Hx of smoking. Denies hx of clots, fevers, or chills.

## 2021-08-23 NOTE — ED Notes (Signed)
ED Provider at bedside. 

## 2021-08-23 NOTE — ED Notes (Signed)
Pt vomited all over the toilet and floor. This RN cleaned up the area.

## 2021-09-02 ENCOUNTER — Ambulatory Visit: Payer: Medicare Other | Admitting: Surgery

## 2021-09-30 ENCOUNTER — Ambulatory Visit: Payer: Medicare Other | Admitting: Surgery

## 2022-04-02 ENCOUNTER — Emergency Department
Admission: EM | Admit: 2022-04-02 | Discharge: 2022-04-02 | Disposition: A | Discharge status: Home / Self Care | Payer: 59 | Attending: Emergency Medicine | Admitting: Emergency Medicine

## 2022-04-02 ENCOUNTER — Other Ambulatory Visit: Payer: Self-pay

## 2022-04-02 DIAGNOSIS — K029 Dental caries, unspecified: Secondary | ICD-10-CM | POA: Diagnosis not present

## 2022-04-02 DIAGNOSIS — K047 Periapical abscess without sinus: Secondary | ICD-10-CM | POA: Diagnosis not present

## 2022-04-02 DIAGNOSIS — K0889 Other specified disorders of teeth and supporting structures: Secondary | ICD-10-CM | POA: Diagnosis present

## 2022-04-02 LAB — COMPREHENSIVE METABOLIC PANEL
ALT: 16 U/L (ref 0–44)
AST: 16 U/L (ref 15–41)
Albumin: 4.3 g/dL (ref 3.5–5.0)
Alkaline Phosphatase: 60 U/L (ref 38–126)
Anion gap: 8 (ref 5–15)
BUN: 14 mg/dL (ref 6–20)
CO2: 28 mmol/L (ref 22–32)
Calcium: 9.1 mg/dL (ref 8.9–10.3)
Chloride: 100 mmol/L (ref 98–111)
Creatinine, Ser: 0.9 mg/dL (ref 0.61–1.24)
GFR, Estimated: >60 mL/min (ref >60)
Glucose, Bld: 101 mg/dL — ABNORMAL HIGH (ref 70–99)
Potassium: 4.6 mmol/L (ref 3.5–5.1)
Sodium: 136 mmol/L (ref 135–145)
Total Bilirubin: 0.8 mg/dL (ref 0.3–1.2)
Total Protein: 7.9 g/dL (ref 6.5–8.1)

## 2022-04-02 LAB — CBC WITH DIFFERENTIAL/PLATELET
Abs Immature Granulocytes: 0.03 10*3/uL (ref 0.00–0.07)
Basophils Absolute: 0.1 10*3/uL (ref 0.0–0.1)
Basophils Relative: 1 %
Eosinophils Absolute: 0.1 10*3/uL (ref 0.0–0.5)
Eosinophils Relative: 1 %
HCT: 46.3 % (ref 39.0–52.0)
Hemoglobin: 15.3 g/dL (ref 13.0–17.0)
Immature Granulocytes: 0 %
Lymphocytes Relative: 21 %
Lymphs Abs: 2.5 10*3/uL (ref 0.7–4.0)
MCH: 31.2 pg (ref 26.0–34.0)
MCHC: 33 g/dL (ref 30.0–36.0)
MCV: 94.3 fL (ref 80.0–100.0)
Monocytes Absolute: 1.2 10*3/uL — ABNORMAL HIGH (ref 0.1–1.0)
Monocytes Relative: 10 %
Neutro Abs: 7.9 10*3/uL — ABNORMAL HIGH (ref 1.7–7.7)
Neutrophils Relative %: 67 %
Platelets: 284 10*3/uL (ref 150–400)
RBC: 4.91 MIL/uL (ref 4.22–5.81)
RDW: 12.8 % (ref 11.5–15.5)
WBC: 11.9 10*3/uL — ABNORMAL HIGH (ref 4.0–10.5)
nRBC: 0 % (ref 0.0–0.2)

## 2022-04-02 LAB — LACTIC ACID, PLASMA: Lactic Acid, Venous: 0.7 mmol/L (ref 0.5–1.9)

## 2022-04-02 MED ORDER — HYDROCODONE-ACETAMINOPHEN 5-325 MG PO TABS
1.0000 | ORAL_TABLET | Freq: Once | ORAL | Status: AC
  Administered 2022-04-02: 1 via ORAL
  Filled 2022-04-02: qty 1

## 2022-04-02 MED ORDER — HYDROCODONE-ACETAMINOPHEN 5-325 MG PO TABS: 1.0000 | ORAL_TABLET | Freq: Four times a day (QID) | ORAL | 0 refills | Status: DC | PRN

## 2022-04-02 MED ORDER — KETOROLAC TROMETHAMINE 10 MG PO TABS
10.0000 mg | ORAL_TABLET | Freq: Once | ORAL | Status: AC
  Administered 2022-04-02: 10 mg via ORAL
  Filled 2022-04-02: qty 1

## 2022-04-02 MED ORDER — CLINDAMYCIN HCL 150 MG PO CAPS: 450.0000 mg | ORAL_CAPSULE | Freq: Three times a day (TID) | ORAL | 0 refills | Status: DC

## 2022-04-02 NOTE — ED Provider Notes (Signed)
[facility] Provider Note   Event Date/Time   First MD Initiated Contact with Patient 04/02/22 2012     (approximate) History  Dental Pain  HPI LESS CHOU is a 42 y.o. male with a stated past medical history of recently diagnosed left mandibular dental abscess who presents complaining of continued pain despite using prescribed clindamycin for the past 5 days.  Patient states that he was seen by a dentistry and placed on amoxicillin and subsequently on clindamycin with no improvement in patient's swelling or pain.  Patient denies any plan by dentistry for extraction at this time. ROS: Patient currently denies any vision changes, tinnitus, difficulty speaking, facial droop, sore throat, chest pain, shortness of breath, abdominal pain, nausea/vomiting/diarrhea, dysuria, or weakness/numbness/paresthesias in any extremity   Physical Exam  Triage Vital Signs: ED Triage Vitals [04/02/22 1804]  Enc Vitals Group     BP (!) 149/98     Pulse Rate 75     Resp 18     Temp 98.3 F (36.8 C)     Temp Source Oral     SpO2 98 %     Weight      Height      Head Circumference      Peak Flow      Pain Score 10     Pain Loc      Pain Edu?      Excl. in [place]?    Most recent vital signs: Vitals:   04/02/22 1804  BP: (!) 149/98  Pulse: 75  Resp: 18  Temp: 98.3 F (36.8 C)  SpO2: 98%   General: Awake, oriented x4. CV:  Good peripheral perfusion.  Resp:  Normal effort.  Abd:  No distention.  Other:  Middle-aged Caucasian male laying in bed in no acute distress.  Left mandibular molars with dental caries as well as gingival erythema.  Swelling at the left submandibular area with tenderness to palpation and erythema ED Results / Procedures / Treatments  Labs (all labs ordered are listed, but only abnormal results are displayed) Labs Reviewed  COMPREHENSIVE METABOLIC PANEL - Abnormal; Notable for the following components:      Result Value   Glucose, Bld 101 (*)     All other components within normal limits  CBC WITH DIFFERENTIAL/PLATELET - Abnormal; Notable for the following components:   WBC 11.9 (*)    Neutro Abs 7.9 (*)    Monocytes Absolute 1.2 (*)    All other components within normal limits  LACTIC ACID, PLASMA   PROCEDURES: Critical Care performed: No Procedures MEDICATIONS ORDERED IN ED: Medications  HYDROcodone-acetaminophen (NORCO/VICODIN) 5-325 MG per tablet 1 tablet (1 tablet Oral Given 04/02/22 2047)  ketorolac (TORADOL) tablet 10 mg (10 mg Oral Given 04/02/22 2048)   IMPRESSION / MDM / ASSESSMENT AND PLAN / ED COURSE  I reviewed the triage vital signs and the nursing notes.                              Patient's presentation is most consistent with acute presentation with potential threat to life or bodily function. Patient is a 42 year old male who presents for left mandibular molar carious with associated abscess after clindamycin and amoxicillin prescription. Patient not immunosuppressed. No e/o tooth fracture, avulsion, or bleeding socket. No e/o RPA, PTA, Ludwigs angina, periapical abscess. No e/o gingival hyperplasia or concern for drug reaction.  Rx Ibuprofen, Norco, continue clindamycin and given  resources for alternative dental clinics for extraction of this infected tooth Disposition: Discharge home. Discussed return precautions for odontogenic infections and other dental pain emergencies. Will provide dental clinic list.   FINAL CLINICAL IMPRESSION(S) / ED DIAGNOSES   Final diagnoses:  Dental abscess  Infected dental caries   Rx / DC Orders   ED Discharge Orders          Ordered    clindamycin (CLEOCIN) 150 MG capsule  3 times daily        04/02/22 2044    HYDROcodone-acetaminophen ([place]) 5-325 MG tablet  Every 6 hours PRN        04/02/22 2044           Note:  This document was prepared using Dragon voice recognition software and may include unintentional dictation errors.   Naaman Plummer,  MD 04/02/22 [phone]

## 2022-04-02 NOTE — ED Triage Notes (Signed)
Pt to ED via POV from home. Pt reports abscess on left lower mouth. Pt was given antibiotics with no resolution and was placed on another antibiotic. Pt reports pain continues to get worse.

## 2022-04-06 ENCOUNTER — Emergency Department: Payer: 59

## 2022-04-06 ENCOUNTER — Inpatient Hospital Stay
Admission: EM | Admit: 2022-04-06 | Discharge: 2022-04-07 | DRG: 159 | Discharge status: Against Medical Advice | Payer: 59 | Attending: Internal Medicine | Admitting: Internal Medicine

## 2022-04-06 ENCOUNTER — Other Ambulatory Visit: Payer: Self-pay

## 2022-04-06 DIAGNOSIS — Z5329 Procedure and treatment not carried out because of patient's decision for other reasons: Secondary | ICD-10-CM | POA: Diagnosis not present

## 2022-04-06 DIAGNOSIS — F319 Bipolar disorder, unspecified: Secondary | ICD-10-CM | POA: Insufficient documentation

## 2022-04-06 DIAGNOSIS — R590 Localized enlarged lymph nodes: Secondary | ICD-10-CM | POA: Diagnosis not present

## 2022-04-06 DIAGNOSIS — F17210 Nicotine dependence, cigarettes, uncomplicated: Secondary | ICD-10-CM | POA: Diagnosis present

## 2022-04-06 DIAGNOSIS — K122 Cellulitis and abscess of mouth: Principal | ICD-10-CM | POA: Diagnosis present

## 2022-04-06 DIAGNOSIS — F4310 Post-traumatic stress disorder, unspecified: Secondary | ICD-10-CM | POA: Diagnosis present

## 2022-04-06 DIAGNOSIS — K047 Periapical abscess without sinus: Secondary | ICD-10-CM

## 2022-04-06 LAB — COMPREHENSIVE METABOLIC PANEL
ALT: 13 U/L (ref 0–44)
AST: 15 U/L (ref 15–41)
Albumin: 4.2 g/dL (ref 3.5–5.0)
Alkaline Phosphatase: 68 U/L (ref 38–126)
Anion gap: 11 (ref 5–15)
BUN: 14 mg/dL (ref 6–20)
CO2: 22 mmol/L (ref 22–32)
Calcium: 9.2 mg/dL (ref 8.9–10.3)
Chloride: 98 mmol/L (ref 98–111)
Creatinine, Ser: 0.8 mg/dL (ref 0.61–1.24)
GFR, Estimated: >60 mL/min (ref >60)
Glucose, Bld: 108 mg/dL — ABNORMAL HIGH (ref 70–99)
Potassium: 3.9 mmol/L (ref 3.5–5.1)
Sodium: 131 mmol/L — ABNORMAL LOW (ref 135–145)
Total Bilirubin: 1 mg/dL (ref 0.3–1.2)
Total Protein: 8.4 g/dL — ABNORMAL HIGH (ref 6.5–8.1)

## 2022-04-06 LAB — CBC WITH DIFFERENTIAL/PLATELET
Abs Immature Granulocytes: 0.05 10*3/uL (ref 0.00–0.07)
Basophils Absolute: 0.1 10*3/uL (ref 0.0–0.1)
Basophils Relative: 0 %
Eosinophils Absolute: 0.1 10*3/uL (ref 0.0–0.5)
Eosinophils Relative: 1 %
HCT: 47 % (ref 39.0–52.0)
Hemoglobin: 15.8 g/dL (ref 13.0–17.0)
Immature Granulocytes: 0 %
Lymphocytes Relative: 16 %
Lymphs Abs: 2.2 10*3/uL (ref 0.7–4.0)
MCH: 30.7 pg (ref 26.0–34.0)
MCHC: 33.6 g/dL (ref 30.0–36.0)
MCV: 91.3 fL (ref 80.0–100.0)
Monocytes Absolute: 1.7 10*3/uL — ABNORMAL HIGH (ref 0.1–1.0)
Monocytes Relative: 12 %
Neutro Abs: 10 10*3/uL — ABNORMAL HIGH (ref 1.7–7.7)
Neutrophils Relative %: 71 %
Platelets: 298 10*3/uL (ref 150–400)
RBC: 5.15 MIL/uL (ref 4.22–5.81)
RDW: 12.3 % (ref 11.5–15.5)
WBC: 14.1 10*3/uL — ABNORMAL HIGH (ref 4.0–10.5)
nRBC: 0 % (ref 0.0–0.2)

## 2022-04-06 LAB — LACTIC ACID, PLASMA: Lactic Acid, Venous: 0.9 mmol/L (ref 0.5–1.9)

## 2022-04-06 MED ORDER — SODIUM CHLORIDE 0.9 % IV SOLN: Freq: Once | INTRAVENOUS | Status: AC

## 2022-04-06 MED ORDER — MORPHINE SULFATE (PF) 4 MG/ML IV SOLN
4.0000 mg | INTRAVENOUS | Status: DC | PRN
  Administered 2022-04-06 – 2022-04-07 (×4): 4 mg via INTRAVENOUS
  Filled 2022-04-06 (×4): qty 1

## 2022-04-06 MED ORDER — LIDOCAINE VISCOUS HCL 2 % MT SOLN
15.0000 mL | Freq: Once | OROMUCOSAL | Status: AC
  Administered 2022-04-06: 15 mL via OROMUCOSAL
  Filled 2022-04-06: qty 15

## 2022-04-06 MED ORDER — DEXAMETHASONE SODIUM PHOSPHATE 10 MG/ML IJ SOLN
10.0000 mg | Freq: Once | INTRAMUSCULAR | Status: AC
  Administered 2022-04-06: 10 mg via INTRAVENOUS
  Filled 2022-04-06: qty 1

## 2022-04-06 MED ORDER — IOHEXOL 300 MG/ML  SOLN
75.0000 mL | Freq: Once | INTRAMUSCULAR | Status: AC | PRN
  Administered 2022-04-06: 75 mL via INTRAVENOUS

## 2022-04-06 MED ORDER — SODIUM CHLORIDE 0.9 % IV BOLUS
1000.0000 mL | Freq: Once | INTRAVENOUS | Status: AC
  Administered 2022-04-06: 1000 mL via INTRAVENOUS

## 2022-04-06 MED ORDER — KETOROLAC TROMETHAMINE 30 MG/ML IJ SOLN
15.0000 mg | Freq: Once | INTRAMUSCULAR | Status: AC
  Administered 2022-04-06: 15 mg via INTRAVENOUS
  Filled 2022-04-06: qty 1

## 2022-04-06 MED ORDER — SODIUM CHLORIDE 0.9 % IV SOLN
3.0000 g | Freq: Four times a day (QID) | INTRAVENOUS | Status: DC
  Administered 2022-04-07 (×3): 3 g via INTRAVENOUS
  Filled 2022-04-06: qty 8
  Filled 2022-04-06: qty 3
  Filled 2022-04-06 (×3): qty 8

## 2022-04-06 MED ORDER — ONDANSETRON HCL 4 MG/2ML IJ SOLN
4.0000 mg | Freq: Once | INTRAMUSCULAR | Status: AC
  Administered 2022-04-06: 4 mg via INTRAVENOUS
  Filled 2022-04-06: qty 2

## 2022-04-06 MED ORDER — DEXAMETHASONE SODIUM PHOSPHATE 10 MG/ML IJ SOLN
10.0000 mg | Freq: Three times a day (TID) | INTRAMUSCULAR | Status: DC
  Administered 2022-04-07 (×2): 10 mg via INTRAVENOUS
  Filled 2022-04-06 (×2): qty 1

## 2022-04-06 MED ORDER — SODIUM CHLORIDE 0.9 % IV SOLN
3.0000 g | Freq: Once | INTRAVENOUS | Status: AC
  Administered 2022-04-06: 3 g via INTRAVENOUS
  Filled 2022-04-06: qty 8

## 2022-04-06 MED ORDER — LIDOCAINE-EPINEPHRINE 2 %-1:100000 IJ SOLN
20.0000 mL | Freq: Once | INTRAMUSCULAR | Status: AC
  Administered 2022-04-06: 20 mL via INTRADERMAL
  Filled 2022-04-06: qty 1

## 2022-04-06 NOTE — ED Triage Notes (Addendum)
Pt to ED from home for dental abscess that is not improving via PO antibiotics. He has been seen several times with no relief. Pt is CAOx4 and in no acute distress in triage., Pt has obvious swelling to the left side of the face.  Pt advised he had a fever at home of 101.3, he self medicated with ibuprofen and hydrocodone.

## 2022-04-06 NOTE — Progress Notes (Signed)
Pharmacy Antibiotic Note  David Burch is a 42 y.o. male admitted on 04/06/2022 with  dental infection .  Pharmacy has been consulted for Unasyn dosing.  Plan: Unasyn 3 gm IV X 1 givne in ED on 3/17 @ 2118. Unasyn 3 gm IV Q6H ordered to continue on 3/18 @ 0300.   Height: 5\' 7"  (170.2 cm) Weight: 104.3 kg (230 lb) IBW/kg (Calculated) : 66.1  Temp (24hrs), Avg:98.4 F (36.9 C), Min:98.4 F (36.9 C), Max:98.4 F (36.9 C)  Recent Labs  Lab 04/02/22 1807 04/06/22 1840  WBC 11.9* 14.1*  CREATININE 0.90 0.80  LATICACIDVEN 0.7 0.9    Estimated Creatinine Clearance: 137.1 mL/min (by C-G formula based on SCr of 0.8 mg/dL).    No Known Allergies  Antimicrobials this admission:   >>    >>   Dose adjustments this admission:   Microbiology results:  BCx:   UCx:    Sputum:    MRSA PCR:   Thank you for allowing pharmacy to be a part of this patient's care.  Mance Vallejo D 04/07/2022 12:05 AM

## 2022-04-06 NOTE — ED Provider Notes (Signed)
[facility] Provider Note    Event Date/Time   First MD Initiated Contact with Patient 04/06/22 2049     (approximate)   History   Dental Pain and Oral Swelling (X3 weeks)   HPI  David Burch is a 42 y.o. male no significant past medical history presents to the ER for evaluation of progressively worsening left jaw and dental pain.  Roughly 2 weeks ago was seen in walk-in dental clinic and was given prescription for amoxicillin due to suppose it dental caries will plan follow-up.  Had a ER visit due to worsening swelling was transition to clindamycin.  He is taking clindamycin without much improvement returns today because over the past 24 hours has had fairly rapid progression of redness swelling extending from the left jaw to under his mouth down his neck and having trouble opening his mouth.  Has not been able to keep anything down for the past 24 hours.  Did have fever at home.     Physical Exam   Triage Vital Signs: ED Triage Vitals  Enc Vitals Group     BP 04/06/22 1835 (!) 147/86     Pulse Rate 04/06/22 1835 98     Resp 04/06/22 1835 18     Temp 04/06/22 1835 98.4 F (36.9 C)     Temp Source 04/06/22 1835 Oral     SpO2 04/06/22 1835 95 %     Weight 04/06/22 1836 230 lb (104.3 kg)     Height 04/06/22 1836 5\' 7"  (1.702 m)     Head Circumference --      Peak Flow --      Pain Score 04/06/22 1836 10     Pain Loc --      Pain Edu? --      Excl. in [place]? --     Most recent vital signs: Vitals:   04/06/22 2100 04/06/22 2330  BP: (!) 148/104 (!) 146/82  Pulse: 92 85  Resp:    Temp:    SpO2: 97% 93%     Constitutional: Alert  Eyes: Conjunctivae are normal.  Head: Atraumatic. Nose: No congestion/rhinnorhea. Mouth/Throat: Mucous membranes are moist.  Carious dentition.  Only able to open jaw 1 cm.  Has mandibular firmness warmth and erythema left greater than right but it does cross midline with some erythema and induration going down the  neck. Neck: Painless ROM.  Normal phonation no stridor Cardiovascular:   Good peripheral circulation. Respiratory: Normal respiratory effort.  No retractions.  Gastrointestinal: Soft and nontender.  Musculoskeletal:  no deformity Neurologic:  MAE spontaneously. No gross focal neurologic deficits are appreciated.  Skin:  Skin is warm, dry and intact. No rash noted. Psychiatric: Mood and affect are normal. Speech and behavior are normal.    ED Results / Procedures / Treatments   Labs (all labs ordered are listed, but only abnormal results are displayed) Labs Reviewed  COMPREHENSIVE METABOLIC PANEL - Abnormal; Notable for the following components:      Result Value   Sodium 131 (*)    Glucose, Bld 108 (*)    Total Protein 8.4 (*)    All other components within normal limits  CBC WITH DIFFERENTIAL/PLATELET - Abnormal; Notable for the following components:   WBC 14.1 (*)    Neutro Abs 10.0 (*)    Monocytes Absolute 1.7 (*)    All other components within normal limits  LACTIC ACID, PLASMA     EKG     RADIOLOGY  Please see ED Course for my review and interpretation.  I personally reviewed all radiographic images ordered to evaluate for the above acute complaints and reviewed radiology reports and findings.  These findings were personally discussed with the patient.  Please see medical record for radiology report.    PROCEDURES:  Critical Care performed: No  Procedures   MEDICATIONS ORDERED IN ED: Medications  morphine (PF) 4 MG/ML injection 4 mg (4 mg Intravenous Given 04/06/22 2217)  sodium chloride 0.9 % bolus 1,000 mL (0 mLs Intravenous Stopped 04/06/22 2155)  ondansetron (ZOFRAN) injection 4 mg (4 mg Intravenous Given 04/06/22 2118)  Ampicillin-Sulbactam (UNASYN) 3 g in sodium chloride 0.9 % 100 mL IVPB (0 g Intravenous Stopped 04/06/22 2155)  iohexol (OMNIPAQUE) 300 MG/ML solution 75 mL (75 mLs Intravenous Contrast Given 04/06/22 2104)  lidocaine-EPINEPHrine  (XYLOCAINE W/EPI) 2 %-1:100000 (with pres) injection 20 mL (20 mLs Intradermal Given 04/06/22 2253)  lidocaine (XYLOCAINE) 2 % viscous mouth solution 15 mL (15 mLs Mouth/Throat Given 04/06/22 2217)  ketorolac (TORADOL) 30 MG/ML injection 15 mg (15 mg Intravenous Given 04/06/22 2216)  dexamethasone (DECADRON) injection 10 mg (10 mg Intravenous Given 04/06/22 2322)  0.9 %  sodium chloride infusion ( Intravenous New Bag/Given 04/06/22 2327)     IMPRESSION / MDM / ASSESSMENT AND PLAN / ED COURSE  I reviewed the triage vital signs and the nursing notes.                              Differential diagnosis includes, but is not limited to, periapical abscess, parotitis, [name], cellulitis, glossitis, epiglottitis, PTA  Patient presenting to the ER for evaluation of symptoms as described above.  Based on symptoms, risk factors and considered above differential, this presenting complaint could reflect a potentially life-threatening illness therefore the patient will be placed on continuous pulse oximetry and telemetry for monitoring.  Laboratory evaluation will be sent to evaluate for the above complaints.  Patient is protecting his airway there is no stridor but he has pretty significant trismus and I am concerned for deep space infection.  Not able to palpate clear periapical abscess.  CT imaging ordered for the but differential will order IV fluids as well as IV morphine.   Clinical Course as of 04/06/22 2345  Nancy Fetter Apr 06, 2022  2302 After discussing options and patient consented procedure made attempts at draining this periapical abscess was unable to draining purulent fluid with 18-gauge made 2 attempts at I&D with 11 blade but procedure somewhat limited secondary to patient's trismus.  He is protecting his airway. [PR]  2324 Discussed workup and presentation with Dr. Kathyrn Sheriff of ENT.  Agrees with plan for IV antibiotics and steroids.  Will consult hospitalist for admission given his trismus poor  p.o. intake.  Does not have any in pending airway involvement on exam at this time.  Hope is that the swelling and infection will defervesce overnight with IV antibiotics and steroids and can be transition to p.o. medications and ultimately discharged without further intervention.  Patient is happy and agreeable with this plan.  Hospitalist consulted for admission. [PR]    Clinical Course User Index [PR] Merlyn Lot, MD     FINAL CLINICAL IMPRESSION(S) / ED DIAGNOSES   Final diagnoses:  [name]  Dental abscess     Rx / DC Orders   ED Discharge Orders     None        Note:  This  document was prepared using Systems analyst and may include unintentional dictation errors.    Merlyn Lot, MD 04/06/22 [phone]

## 2022-04-06 NOTE — ED Notes (Signed)
One set of cultures sent down

## 2022-04-06 NOTE — H&P (Incomplete)
History and Physical    Patient: David Burch [id number] DOB: [birth year] DOA: 04/06/2022 DOS: the patient was seen and examined on 04/06/2022 PCP: Pcp, No  Patient coming from: Home  Chief Complaint:  Chief Complaint  Patient presents with   Dental Pain   Oral Swelling    X3 weeks    HPI: David Burch is a 42 y.o. male with medical history significant for Bipolar mood disorder who presents to the ED with pain and swelling and swelling of the left jaw and dental pain for the past 3 weeks.  Patient was seen in the walk-in dental clinic 2 weeks prior for the pain and was given a prescription for amoxicillin.  Following completion of the antibiotics he continued to have worsening swelling prompting a visit to the ED on 3/13 for which she received a prescription for clindamycin however the symptoms continued to progress and he now has redness extending from the left jaw down to the neck and difficulty opening his mouth causing poor oral intake.  He developed a fever in the past 24 hours thus prompting return visit to the ED. ED course and data review: BP 147/86 with otherwise normal vitals.  WBC 14,000 and lactic acid 0.9 CT soft tissue neck shows a periapical abscess as follows: IMPRESSION: A 2.1 x 2.5 cm abscess at the left retromolar trigone, arising from a periapical lucency of the left second mandibular molar (tooth 22).  The ED provider attempted aspiration without success due to significant trismus even after Toradol and morphine for pain.  He subsequently contacted ENT, Dr. Ladene Artist who will see in the in the a.m. and consider aspiration in the OR if not improving with antibiotics and steroid.  Patient was started on Unasyn and Decadron.  Hospitalist subsequently consulted for admission.   Review of Systems: As mentioned in the history of present illness. All other systems reviewed and are negative.  Past Medical History:  Diagnosis Date   Bipolar 1 disorder ([place])    PTSD  (post-traumatic stress disorder)    Past Surgical History:  Procedure Laterality Date   ORTHOPEDIC SURGERY     Social History:  reports that he has been smoking. He has been smoking an average of .5 packs per day. He has quit using smokeless tobacco. He reports that he does not currently use alcohol. He reports that he does not currently use drugs.  No Known Allergies  History reviewed. No pertinent family history.  Prior to Admission medications   Medication Sig Start Date End Date Taking? Authorizing Provider  clindamycin (CLEOCIN) 150 MG capsule Take 3 capsules (450 mg total) by mouth 3 (three) times daily for 10 days. 04/02/22 04/12/22  Naaman Plummer, MD  cyclobenzaprine (FLEXERIL) 10 MG tablet Take 1 tablet (10 mg total) by mouth 3 (three) times daily as needed. 01/15/18   Sable Feil, PA-C  HYDROcodone-acetaminophen (NORCO) 5-325 MG tablet Take 1 tablet by mouth every 6 (six) hours as needed for moderate pain or severe pain. 04/02/22   Naaman Plummer, MD  ibuprofen (ADVIL,MOTRIN) 600 MG tablet Take 1 tablet (600 mg total) by mouth every 8 (eight) hours as needed. 01/15/18   Sable Feil, PA-C  ondansetron (ZOFRAN ODT) 4 MG disintegrating tablet Allow 1-2 tablets to dissolve in your mouth every 8 hours as needed for nausea/vomiting 02/24/16   Hinda Kehr, MD  ondansetron (ZOFRAN ODT) 4 MG disintegrating tablet Allow 1-2 tablets to dissolve in your mouth every 8 hours as needed  for nausea/vomiting 02/24/16   Hinda Kehr, MD  ondansetron (ZOFRAN-ODT) 4 MG disintegrating tablet Take 1 tablet (4 mg total) by mouth every 8 (eight) hours as needed. 08/23/21   Vladimir Crofts, MD  sulfamethoxazole-trimethoprim (BACTRIM DS,SEPTRA DS) 800-160 MG tablet Take 1 tablet by mouth 2 (two) times daily. 01/15/18   Sable Feil, PA-C  tamsulosin (FLOMAX) 0.4 MG CAPS capsule Take 1 tablet by mouth daily until you pass the kidney stone or no longer have symptoms 02/24/16   Hinda Kehr, MD    Physical  Exam: Vitals:   04/06/22 1835 04/06/22 1836 04/06/22 2100 04/06/22 2330  BP: (!) 147/86  (!) 148/104 (!) 146/82  Pulse: 98  92 85  Resp: 18     Temp: 98.4 F (36.9 C)     TempSrc: Oral     SpO2: 95%  97% 93%  Weight:  104.3 kg    Height:  5\' 7"  (1.702 m)     Physical Exam  Labs on Admission: I have personally reviewed following labs and imaging studies  CBC: Recent Labs  Lab 04/02/22 1807 04/06/22 1840  WBC 11.9* 14.1*  NEUTROABS 7.9* 10.0*  HGB 15.3 15.8  HCT 46.3 47.0  MCV 94.3 91.3  PLT 284 [id number]   Basic Metabolic Panel: Recent Labs  Lab 04/02/22 1807 04/06/22 1840  NA 136 131*  K 4.6 3.9  CL 100 98  CO2 28 22  GLUCOSE 101* 108*  BUN 14 14  CREATININE 0.90 0.80  CALCIUM 9.1 9.2   GFR: Estimated Creatinine Clearance: 137.1 mL/min (by C-G formula based on SCr of 0.8 mg/dL). Liver Function Tests: Recent Labs  Lab 04/02/22 1807 04/06/22 1840  AST 16 15  ALT 16 13  ALKPHOS 60 68  BILITOT 0.8 1.0  PROT 7.9 8.4*  ALBUMIN 4.3 4.2   No results for input(s): "LIPASE", "AMYLASE" in the last 168 hours. No results for input(s): "AMMONIA" in the last 168 hours. Coagulation Profile: No results for input(s): "INR", "PROTIME" in the last 168 hours. Cardiac Enzymes: No results for input(s): "CKTOTAL", "CKMB", "CKMBINDEX", "TROPONINI" in the last 168 hours. BNP (last 3 results) No results for input(s): "PROBNP" in the last 8760 hours. HbA1C: No results for input(s): "HGBA1C" in the last 72 hours. CBG: No results for input(s): "GLUCAP" in the last 168 hours. Lipid Profile: No results for input(s): "CHOL", "HDL", "LDLCALC", "TRIG", "CHOLHDL", "LDLDIRECT" in the last 72 hours. Thyroid Function Tests: No results for input(s): "TSH", "T4TOTAL", "FREET4", "T3FREE", "THYROIDAB" in the last 72 hours. Anemia Panel: No results for input(s): "VITAMINB12", "FOLATE", "FERRITIN", "TIBC", "IRON", "RETICCTPCT" in the last 72 hours. Urine analysis:    Component Value  Date/Time   COLORURINE YELLOW (A) 01/15/2018 0847   APPEARANCEUR CLEAR (A) 01/15/2018 0847   LABSPEC 1.013 01/15/2018 0847   PHURINE 5.0 01/15/2018 0847   GLUCOSEU NEGATIVE 01/15/2018 0847   HGBUR LARGE (A) 01/15/2018 0847   BILIRUBINUR NEGATIVE 01/15/2018 0847   KETONESUR NEGATIVE 01/15/2018 0847   PROTEINUR NEGATIVE 01/15/2018 0847   NITRITE NEGATIVE 01/15/2018 0847   LEUKOCYTESUR NEGATIVE 01/15/2018 0847    Radiological Exams on Admission: CT Soft Tissue Neck W Contrast  Result Date: 04/06/2022 CLINICAL DATA:  Dental pain EXAM: CT NECK WITH CONTRAST TECHNIQUE: Multidetector CT imaging of the neck was performed using the standard protocol following the bolus administration of intravenous contrast. RADIATION DOSE REDUCTION: This exam was performed according to the departmental dose-optimization program which includes automated exposure control, adjustment of the mA and/or kV according  to patient size and/or use of iterative reconstruction technique. CONTRAST:  7mL OMNIPAQUE IOHEXOL 300 MG/ML  SOLN COMPARISON:  None Available. FINDINGS: Pharynx and larynx: There is an abscess at the left retromolar trigone measuring 2.1 x 2.5 cm. This arises from a periapical lucency of the left second mandibular molar (tooth 22). The left platysma is thickened and there is inflammatory change extending into the left face subcutaneous tissues and the left submandibular space. Salivary glands: Reactive enlargement of the left submandibular gland. Thyroid: Normal. Lymph nodes: Reactive left submandibular lymphadenopathy. Vascular: Negative. Limited intracranial: Negative. Visualized orbits: Negative. Mastoids and visualized paranasal sinuses: Clear. Skeleton: No acute or aggressive process. Upper chest: Negative. Other: None. IMPRESSION: A 2.1 x 2.5 cm abscess at the left retromolar trigone, arising from a periapical lucency of the left second mandibular molar (tooth 22). Electronically Signed   By: Ulyses Jarred  M.D.   On: 04/06/2022 21:32   DG Chest 2 View  Result Date: 04/06/2022 CLINICAL DATA:  Pneumonia EXAM: CHEST - 2 VIEW COMPARISON:  08/23/2021 FINDINGS: Lungs are well expanded, symmetric, and clear. No pneumothorax or pleural effusion. Cardiac size within normal limits. Pulmonary vascularity is normal. Remote healed left clavicle fracture. Osseous structures are age-appropriate. No acute bone abnormality. IMPRESSION: No active cardiopulmonary disease. Electronically Signed   By: Fidela Salisbury M.D.   On: 04/06/2022 19:31     Data Reviewed: Relevant notes from primary care and specialist visits, past discharge summaries as available in EHR, including Care Everywhere. Prior diagnostic testing as pertinent to current admission diagnoses Updated medications and problem lists for reconciliation ED course, including vitals, labs, imaging, treatment and response to treatment Triage notes, nursing and pharmacy notes and ED provider's notes Notable results as noted in HPI   Assessment and Plan: No notes have been filed under this hospital service. Service: Hospitalist       DVT prophylaxis: Lovenox***  Consults: none***  Advance Care Planning:   Code Status: Not on file ***  Family Communication: none***  Disposition Plan: Back to previous home environment  Severity of Illness: {Observation/Inpatient:21159}  Author: Athena Masse, MD 04/06/2022 11:55 PM  For on call review [email or web address].

## 2022-04-07 ENCOUNTER — Other Ambulatory Visit: Payer: Self-pay | Admitting: Internal Medicine

## 2022-04-07 DIAGNOSIS — F4310 Post-traumatic stress disorder, unspecified: Secondary | ICD-10-CM | POA: Diagnosis present

## 2022-04-07 DIAGNOSIS — K047 Periapical abscess without sinus: Secondary | ICD-10-CM | POA: Diagnosis present

## 2022-04-07 DIAGNOSIS — Z5329 Procedure and treatment not carried out because of patient's decision for other reasons: Secondary | ICD-10-CM | POA: Diagnosis not present

## 2022-04-07 DIAGNOSIS — K122 Cellulitis and abscess of mouth: Secondary | ICD-10-CM | POA: Diagnosis not present

## 2022-04-07 DIAGNOSIS — F17210 Nicotine dependence, cigarettes, uncomplicated: Secondary | ICD-10-CM | POA: Diagnosis present

## 2022-04-07 DIAGNOSIS — F319 Bipolar disorder, unspecified: Secondary | ICD-10-CM | POA: Diagnosis present

## 2022-04-07 LAB — BASIC METABOLIC PANEL
Anion gap: 8 (ref 5–15)
BUN: 13 mg/dL (ref 6–20)
CO2: 26 mmol/L (ref 22–32)
Calcium: 8.9 mg/dL (ref 8.9–10.3)
Chloride: 102 mmol/L (ref 98–111)
Creatinine, Ser: 0.83 mg/dL (ref 0.61–1.24)
GFR, Estimated: >60 mL/min (ref >60)
Glucose, Bld: 173 mg/dL — ABNORMAL HIGH (ref 70–99)
Potassium: 4.4 mmol/L (ref 3.5–5.1)
Sodium: 136 mmol/L (ref 135–145)

## 2022-04-07 LAB — CBC
HCT: 45.5 % (ref 39.0–52.0)
Hemoglobin: 15 g/dL (ref 13.0–17.0)
MCH: 30.4 pg (ref 26.0–34.0)
MCHC: 33 g/dL (ref 30.0–36.0)
MCV: 92.3 fL (ref 80.0–100.0)
Platelets: 307 10*3/uL (ref 150–400)
RBC: 4.93 MIL/uL (ref 4.22–5.81)
RDW: 12.4 % (ref 11.5–15.5)
WBC: 11.1 10*3/uL — ABNORMAL HIGH (ref 4.0–10.5)
nRBC: 0 % (ref 0.0–0.2)

## 2022-04-07 LAB — HIV ANTIBODY (ROUTINE TESTING W REFLEX): HIV Screen 4th Generation wRfx: NONREACTIVE

## 2022-04-07 MED ORDER — HYDROCODONE-ACETAMINOPHEN 5-325 MG PO TABS
1.0000 | ORAL_TABLET | ORAL | Status: DC | PRN
  Administered 2022-04-07: 1 via ORAL
  Filled 2022-04-07: qty 1

## 2022-04-07 MED ORDER — ACETAMINOPHEN 325 MG PO TABS: 650.0000 mg | ORAL_TABLET | Freq: Four times a day (QID) | ORAL | Status: DC | PRN

## 2022-04-07 MED ORDER — ENOXAPARIN SODIUM 60 MG/0.6ML IJ SOSY
0.5000 mg/kg | PREFILLED_SYRINGE | INTRAMUSCULAR | Status: DC
  Filled 2022-04-07: qty 0.6

## 2022-04-07 MED ORDER — MORPHINE SULFATE (PF) 2 MG/ML IV SOLN: 2.0000 mg | INTRAVENOUS | Status: DC | PRN

## 2022-04-07 MED ORDER — CLINDAMYCIN HCL 150 MG PO CAPS: 450.0000 mg | ORAL_CAPSULE | Freq: Three times a day (TID) | ORAL | 0 refills | Status: DC

## 2022-04-07 MED ORDER — DEXTROSE-NACL 5-0.9 % IV SOLN: INTRAVENOUS | Status: DC

## 2022-04-07 MED ORDER — ONDANSETRON HCL 4 MG PO TABS: 4.0000 mg | ORAL_TABLET | Freq: Four times a day (QID) | ORAL | Status: DC | PRN

## 2022-04-07 MED ORDER — ACETAMINOPHEN 650 MG RE SUPP: 650.0000 mg | Freq: Four times a day (QID) | RECTAL | Status: DC | PRN

## 2022-04-07 MED ORDER — ONDANSETRON HCL 4 MG/2ML IJ SOLN: 4.0000 mg | Freq: Four times a day (QID) | INTRAMUSCULAR | Status: DC | PRN

## 2022-04-07 MED ORDER — KETOROLAC TROMETHAMINE 30 MG/ML IJ SOLN
30.0000 mg | Freq: Four times a day (QID) | INTRAMUSCULAR | Status: DC | PRN
  Administered 2022-04-07 (×2): 30 mg via INTRAVENOUS
  Filled 2022-04-07 (×2): qty 1

## 2022-04-07 NOTE — Final Progress Note (Signed)
Same day rounding progress note  Patient seen and examined.  I agree with Dr. Josefine Burch assessment and plan.  Please see her dictated in the history and physical for further details.  David Burch's angina/tooth abscess Continue IV steroids and antibiotics Soft diet Appreciate ENT input Oral surgery appointment confirmed for [facility] @ [place] office this Wednesday @ 2 pm. In preparation for that we will likely release him after tomorrow's antibiotic and steroid dose so that he can make it to his appointment on Wednesday.  Time spent 35 minutes

## 2022-04-07 NOTE — Assessment & Plan Note (Signed)
Dental abscess Continue Unasyn and Decadron Pain control Keep n.p.o., IV hydration ENT was consulted from the ED and will see in the a.m.

## 2022-04-07 NOTE — Assessment & Plan Note (Signed)
Possible [name] Continue Unasyn and Decadron Pain control Keep n.p.o., IV hydration ENT was consulted from the ED and will see in the a.m.

## 2022-04-07 NOTE — TOC Initial Note (Signed)
Transition of Care [facility]) - Initial/Assessment Note    Patient Details  Name: David Burch MRN: [id number] Date of Birth: 16-Sep-1980  Transition of Care [facility]) CM/SW Contact:    Beverly Sessions, RN Phone Number: 04/07/2022, 4:14 PM  Clinical Narrative:                  Transition of Care (TOC) Screening Note   Patient Details  Name: David Burch Date of Birth: 12-21-1980   Transition of Care [facility]) CM/SW Contact:    Beverly Sessions, RN Phone Number: 04/07/2022, 4:14 PM    Transition of Care Department [facility]) has reviewed patient and no TOC needs have been identified at this time. We will continue to monitor patient advancement through interdisciplinary progression rounds. If new patient transition needs arise, please place a TOC consult.          Patient Goals and CMS Choice            Expected Discharge Plan and Services                                              Prior Living Arrangements/Services                       Activities of Daily Living Home Assistive Devices/Equipment: None ADL Screening (condition at time of admission) Patient's cognitive ability adequate to safely complete daily activities?: Yes Is the patient deaf or have difficulty hearing?: No Does the patient have difficulty seeing, even when wearing glasses/contacts?: No Does the patient have difficulty concentrating, remembering, or making decisions?: No Patient able to express need for assistance with ADLs?: Yes Does the patient have difficulty dressing or bathing?: Yes Independently performs ADLs?: Yes (appropriate for developmental age) Does the patient have difficulty walking or climbing stairs?: No Weakness of Legs: None Weakness of Arms/Hands: None  Permission Sought/Granted                  Emotional Assessment              Admission diagnosis:  Dental abscess [K04.7] [name] [K12.2] Tooth abscess [K04.7] Patient Active Problem  List   Diagnosis Date Noted   Dental abscess 04/07/2022   Tooth abscess 04/07/2022   [name] 04/07/2022   Bipolar 1 disorder ([place]) 04/06/2022   PCP:  Merryl Hacker No Pharmacy:   [facility] [address]), [place] [address] [place]) [place] [address] Phone: [phone] Fax: [phone]     Social Determinants of Health (SDOH) Social History: SDOH Screenings   Food Insecurity: No Food Insecurity (04/07/2022)  Housing: Low Risk  (04/07/2022)  Transportation Needs: No Transportation Needs (04/07/2022)  Utilities: Not At Risk (04/07/2022)  Tobacco Use: High Risk (04/06/2022)   SDOH Interventions:     Readmission Risk Interventions     No data to display

## 2022-04-07 NOTE — Progress Notes (Signed)
Patient requested to leave the hospital AMA. Patient asked nurse to remove IV and let him leave. Patient agitated stating he needs to leave. RN removed the IV, patient signed AMA paper and left on foot. MD notified patient left AMA.

## 2022-04-07 NOTE — TOC Initial Note (Signed)
Transition of Care [facility]) - Initial/Assessment Note    Patient Details  Name: David Burch MRN: [id number] Date of Birth: 1980/06/19  Transition of Care [facility]) CM/SW Contact:    Beverly Sessions, RN Phone Number: 04/07/2022, 4:18 PM  Clinical Narrative:                  Notified by RN that patient is not going to be able to pay for his $100 copay for oral surgeon  Patient has dual insurance coverage, there is not assistance that TOC can provide for copays.  Confirmed with supervisor  MD updated        Patient Goals and CMS Choice            Expected Discharge Plan and Services                                              Prior Living Arrangements/Services                       Activities of Daily Living Home Assistive Devices/Equipment: None ADL Screening (condition at time of admission) Patient's cognitive ability adequate to safely complete daily activities?: Yes Is the patient deaf or have difficulty hearing?: No Does the patient have difficulty seeing, even when wearing glasses/contacts?: No Does the patient have difficulty concentrating, remembering, or making decisions?: No Patient able to express need for assistance with ADLs?: Yes Does the patient have difficulty dressing or bathing?: Yes Independently performs ADLs?: Yes (appropriate for developmental age) Does the patient have difficulty walking or climbing stairs?: No Weakness of Legs: None Weakness of Arms/Hands: None  Permission Sought/Granted                  Emotional Assessment              Admission diagnosis:  Dental abscess [K04.7] [name] [K12.2] Tooth abscess [K04.7] Patient Active Problem List   Diagnosis Date Noted   Dental abscess 04/07/2022   Tooth abscess 04/07/2022   [name] 04/07/2022   Bipolar 1 disorder ([place]) 04/06/2022   PCP:  Merryl Hacker No Pharmacy:   [facility] [address]), [place] [address] [place]) [place] [address] Phone: [phone] Fax: [phone]     Social Determinants of Health (SDOH) Social History: SDOH Screenings   Food Insecurity: No Food Insecurity (04/07/2022)  Housing: Low Risk  (04/07/2022)  Transportation Needs: No Transportation Needs (04/07/2022)  Utilities: Not At Risk (04/07/2022)  Tobacco Use: High Risk (04/06/2022)   SDOH Interventions:     Readmission Risk Interventions     No data to display

## 2022-04-07 NOTE — Consult Note (Addendum)
Mac, [place] [id number] [birth year] Max Sane, MD  Reason for Consult: Evaluate for tooth root abscess now leading to [name]  HPI: Patient is a 42 year old white male who was doing fine until his left posterior molar cracked and then he started having some pain and swelling around this.  He went to his dentist to try to get a tooth pulled but he was started on amoxicillin instead to settle things down.  After week he was worse and went to the emergency department and was given clindamycin.  He continued to take this but the redness worsened then was neck and is now more sore.  It has been 3 weeks since he was originally seen.  Last night in the ED he had a CT scan which showed a small abscess secondary to his left mandibular molar periapical abscess.  He had attempts at draining this and got a small amount of foul-smelling pus out of his mouth seems like is draining through some of the holes.  He was admitted and started on IV antibiotics and some Decadron.  This morning he does not have the severe trismus that he had last night and he is able to get his mouth open much better now.  He feels like he can swallow better and is not having as much pain as before.  Allergies: No Known Allergies  ROS: Review of systems normal other than 12 systems except per HPI.  PMH:  Past Medical History:  Diagnosis Date   Bipolar 1 disorder ([place])    PTSD (post-traumatic stress disorder)     FH: History reviewed. No pertinent family history.  SH:  Social History   Socioeconomic History   Marital status: Single    Spouse name: Not on file   Number of children: Not on file   Years of education: Not on file   Highest education level: Not on file  Occupational History   Not on file  Tobacco Use   Smoking status: Every Day    Packs/day: .5    Types: Cigarettes   Smokeless tobacco: Former  Scientific laboratory technician Use: Former  Substance and Sexual Activity   Alcohol use: Not Currently   Drug use:  Not Currently   Sexual activity: Yes  Other Topics Concern   Not on file  Social History Narrative   Not on file   Social Determinants of Health   Financial Resource Strain: Not on file  Food Insecurity: No Food Insecurity (04/07/2022)   Hunger Vital Sign    Worried About Running Out of Food in the Last Year: Never true    Ran Out of Food in the Last Year: Never true  Transportation Needs: No Transportation Needs (04/07/2022)   PRAPARE - Hydrologist (Medical): No    Lack of Transportation (Non-Medical): No  Physical Activity: Not on file  Stress: Not on file  Social Connections: Not on file  Intimate Partner Violence: Not At Risk (04/07/2022)   Humiliation, Afraid, Rape, and Kick questionnaire    Fear of Current or Ex-Partner: No    Emotionally Abused: No    Physically Abused: No    Sexually Abused: No    PSH:  Past Surgical History:  Procedure Laterality Date   ORTHOPEDIC SURGERY      Physical  Exam: His oral exam shows his second mandibular molar back to having eaten out crown.  There is some gum inflammation around it.  The gingivobuccal sulcus is smooth and  not acutely inflamed or swollen.  Medial to the mandible the tongue is not swollen he has good mobility.  The floor mouth is not swollen at all.  He can stick his tongue out well without problems.  He can open his mouth now to an interincisal distance of about 1-1/2 cm.  His posterior pharynx peers to be clear and no evidence of swelling here.  He has swelling on the outside of his mandible near the angle of the jaw and swelling extends in the submandibular triangle area.  There is no area of softness or fluctuance that I can feel.  He does not have significant skin changes here.  He does have swollen lymph node left side of neck level 2.   A/P: He had a tooth root abscess that now has drained laterally on the bone and extended under the periosteum towards the retromolar trigone area.  He also  has some swelling down into the submandibular triangle but none of this is medial to the mandible.  He has been on IV Unasyn 3 g every 6 hours which is appropriate and IV Decadron to help take down swelling.  There has been significant improvable in the last 12 hours of medication.  It does not appear as though he will need any further drainage procedure at this time.  He should remain on IV antibiotics for at least 48 hours which would take him through to a Wednesday morning discharge if he continues to improve.  He may start on thin liquids and a soft diet. He should rinse his mouth multiple times a day. He will need to remain on oral antibiotics and a prednisone taper at home.  If however he should worsen we can reevaluate.  The most important thing for him when he is discharged is to set up an appointment to get the tooth removed, because if this is not taken out then he will probably get a recurrence of his infection again.  He does not need ENT follow-up if he is doing well but instead should see the dentist or oral surgeon who can remove the tooth.   Elon Alas Hilary Milks 04/07/2022 12:28 PM

## 2022-04-09 ENCOUNTER — Other Ambulatory Visit: Payer: Self-pay

## 2022-04-09 ENCOUNTER — Encounter: Payer: Self-pay | Admitting: Emergency Medicine

## 2022-04-09 ENCOUNTER — Inpatient Hospital Stay: Payer: 59 | Admitting: Anesthesiology

## 2022-04-09 ENCOUNTER — Inpatient Hospital Stay
Admission: EM | Admit: 2022-04-09 | Discharge: 2022-04-11 | DRG: 872 | Disposition: A | Discharge status: Home / Self Care | Payer: 59 | Attending: Internal Medicine | Admitting: Internal Medicine

## 2022-04-09 ENCOUNTER — Emergency Department: Payer: 59

## 2022-04-09 ENCOUNTER — Encounter
Admission: EM | Disposition: A | Discharge status: Home / Self Care | Payer: Self-pay | Source: Home / Self Care | Attending: Internal Medicine

## 2022-04-09 DIAGNOSIS — F4310 Post-traumatic stress disorder, unspecified: Secondary | ICD-10-CM | POA: Diagnosis present

## 2022-04-09 DIAGNOSIS — E669 Obesity, unspecified: Secondary | ICD-10-CM | POA: Diagnosis present

## 2022-04-09 DIAGNOSIS — Z888 Allergy status to other drugs, medicaments and biological substances status: Secondary | ICD-10-CM

## 2022-04-09 DIAGNOSIS — F319 Bipolar disorder, unspecified: Secondary | ICD-10-CM | POA: Diagnosis present

## 2022-04-09 DIAGNOSIS — L0211 Cutaneous abscess of neck: Secondary | ICD-10-CM | POA: Diagnosis not present

## 2022-04-09 DIAGNOSIS — K122 Cellulitis and abscess of mouth: Secondary | ICD-10-CM | POA: Diagnosis present

## 2022-04-09 DIAGNOSIS — R000 Tachycardia, unspecified: Secondary | ICD-10-CM | POA: Diagnosis not present

## 2022-04-09 DIAGNOSIS — Z6834 Body mass index (BMI) 34.0-34.9, adult: Secondary | ICD-10-CM

## 2022-04-09 DIAGNOSIS — Z833 Family history of diabetes mellitus: Secondary | ICD-10-CM | POA: Diagnosis not present

## 2022-04-09 DIAGNOSIS — L03221 Cellulitis of neck: Secondary | ICD-10-CM | POA: Diagnosis not present

## 2022-04-09 DIAGNOSIS — L03211 Cellulitis of face: Secondary | ICD-10-CM | POA: Diagnosis present

## 2022-04-09 DIAGNOSIS — A419 Sepsis, unspecified organism: Principal | ICD-10-CM | POA: Diagnosis present

## 2022-04-09 DIAGNOSIS — Z8249 Family history of ischemic heart disease and other diseases of the circulatory system: Secondary | ICD-10-CM | POA: Diagnosis not present

## 2022-04-09 DIAGNOSIS — E876 Hypokalemia: Secondary | ICD-10-CM | POA: Diagnosis present

## 2022-04-09 DIAGNOSIS — K029 Dental caries, unspecified: Secondary | ICD-10-CM | POA: Diagnosis present

## 2022-04-09 DIAGNOSIS — R221 Localized swelling, mass and lump, neck: Secondary | ICD-10-CM | POA: Diagnosis not present

## 2022-04-09 DIAGNOSIS — K047 Periapical abscess without sinus: Secondary | ICD-10-CM | POA: Diagnosis present

## 2022-04-09 DIAGNOSIS — F17210 Nicotine dependence, cigarettes, uncomplicated: Secondary | ICD-10-CM | POA: Diagnosis present

## 2022-04-09 HISTORY — PX: INCISION AND DRAINAGE ABSCESS: SHX5864

## 2022-04-09 HISTORY — DX: Tobacco use: Z72.0

## 2022-04-09 LAB — CBC WITH DIFFERENTIAL/PLATELET
Abs Immature Granulocytes: 0.13 10*3/uL — ABNORMAL HIGH (ref 0.00–0.07)
Basophils Absolute: 0.1 10*3/uL (ref 0.0–0.1)
Basophils Relative: 0 %
Eosinophils Absolute: 0.1 10*3/uL (ref 0.0–0.5)
Eosinophils Relative: 0 %
HCT: 42.3 % (ref 39.0–52.0)
Hemoglobin: 14.1 g/dL (ref 13.0–17.0)
Immature Granulocytes: 1 %
Lymphocytes Relative: 11 %
Lymphs Abs: 1.9 10*3/uL (ref 0.7–4.0)
MCH: 30.9 pg (ref 26.0–34.0)
MCHC: 33.3 g/dL (ref 30.0–36.0)
MCV: 92.6 fL (ref 80.0–100.0)
Monocytes Absolute: 2 10*3/uL — ABNORMAL HIGH (ref 0.1–1.0)
Monocytes Relative: 11 %
Neutro Abs: 13.9 10*3/uL — ABNORMAL HIGH (ref 1.7–7.7)
Neutrophils Relative %: 77 %
Platelets: 295 10*3/uL (ref 150–400)
RBC: 4.57 MIL/uL (ref 4.22–5.81)
RDW: 12.6 % (ref 11.5–15.5)
WBC: 18 10*3/uL — ABNORMAL HIGH (ref 4.0–10.5)
nRBC: 0 % (ref 0.0–0.2)

## 2022-04-09 LAB — BASIC METABOLIC PANEL
Anion gap: 7 (ref 5–15)
BUN: 15 mg/dL (ref 6–20)
CO2: 26 mmol/L (ref 22–32)
Calcium: 8.7 mg/dL — ABNORMAL LOW (ref 8.9–10.3)
Chloride: 100 mmol/L (ref 98–111)
Creatinine, Ser: 0.9 mg/dL (ref 0.61–1.24)
GFR, Estimated: >60 mL/min (ref >60)
Glucose, Bld: 130 mg/dL — ABNORMAL HIGH (ref 70–99)
Potassium: 3.4 mmol/L — ABNORMAL LOW (ref 3.5–5.1)
Sodium: 133 mmol/L — ABNORMAL LOW (ref 135–145)

## 2022-04-09 LAB — SEDIMENTATION RATE: Sed Rate: 17 mm/hr — ABNORMAL HIGH (ref 0–15)

## 2022-04-09 LAB — MAGNESIUM: Magnesium: 2 mg/dL (ref 1.7–2.4)

## 2022-04-09 LAB — PROCALCITONIN: Procalcitonin: <0.1 ng/mL

## 2022-04-09 LAB — APTT: aPTT: 33 seconds (ref 24–36)

## 2022-04-09 LAB — LACTIC ACID, PLASMA
Lactic Acid, Venous: 0.8 mmol/L (ref 0.5–1.9)
Lactic Acid, Venous: 0.7 mmol/L (ref 0.5–1.9)

## 2022-04-09 LAB — PROTIME-INR
INR: 1.1 (ref 0.8–1.2)
Prothrombin Time: 14.4 seconds (ref 11.4–15.2)

## 2022-04-09 SURGERY — INCISION AND DRAINAGE, ABSCESS
Anesthesia: General | Site: Neck

## 2022-04-09 MED ORDER — HYDROMORPHONE HCL 1 MG/ML IJ SOLN
0.5000 mg | Freq: Once | INTRAMUSCULAR | Status: AC
  Administered 2022-04-09: 0.5 mg via INTRAVENOUS
  Filled 2022-04-09: qty 0.5

## 2022-04-09 MED ORDER — ONDANSETRON HCL 4 MG/2ML IJ SOLN
INTRAMUSCULAR | Status: AC
  Filled 2022-04-09: qty 2

## 2022-04-09 MED ORDER — ONDANSETRON HCL 4 MG/2ML IJ SOLN: 4.0000 mg | Freq: Three times a day (TID) | INTRAMUSCULAR | Status: DC | PRN

## 2022-04-09 MED ORDER — SODIUM CHLORIDE 0.9 % IV BOLUS
1000.0000 mL | Freq: Once | INTRAVENOUS | Status: AC
  Administered 2022-04-09: 1000 mL via INTRAVENOUS

## 2022-04-09 MED ORDER — NICOTINE 21 MG/24HR TD PT24
21.0000 mg | MEDICATED_PATCH | Freq: Every day | TRANSDERMAL | Status: DC
  Filled 2022-04-09: qty 1

## 2022-04-09 MED ORDER — SODIUM CHLORIDE 0.9 % IV SOLN: INTRAVENOUS | Status: DC

## 2022-04-09 MED ORDER — DEXAMETHASONE SODIUM PHOSPHATE 10 MG/ML IJ SOLN
INTRAMUSCULAR | Status: AC
  Filled 2022-04-09: qty 1

## 2022-04-09 MED ORDER — SODIUM CHLORIDE 0.9 % IV SOLN
3.0000 g | Freq: Once | INTRAVENOUS | Status: AC
  Administered 2022-04-09: 3 g via INTRAVENOUS
  Filled 2022-04-09: qty 8

## 2022-04-09 MED ORDER — DEXAMETHASONE SODIUM PHOSPHATE 10 MG/ML IJ SOLN
INTRAMUSCULAR | Status: DC | PRN
  Administered 2022-04-09: 10 mg via INTRAVENOUS

## 2022-04-09 MED ORDER — OXYCODONE HCL 5 MG PO TABS: 5.0000 mg | ORAL_TABLET | Freq: Once | ORAL | Status: DC | PRN

## 2022-04-09 MED ORDER — LIDOCAINE-EPINEPHRINE 1 %-1:100000 IJ SOLN
INTRAMUSCULAR | Status: DC | PRN
  Administered 2022-04-09: 4 mL

## 2022-04-09 MED ORDER — FENTANYL CITRATE (PF) 100 MCG/2ML IJ SOLN
INTRAMUSCULAR | Status: AC
  Filled 2022-04-09: qty 2

## 2022-04-09 MED ORDER — BACITRACIN ZINC 500 UNIT/GM EX OINT
TOPICAL_OINTMENT | CUTANEOUS | Status: AC
  Filled 2022-04-09: qty 28.35

## 2022-04-09 MED ORDER — ROCURONIUM BROMIDE 100 MG/10ML IV SOLN
INTRAVENOUS | Status: DC | PRN
  Administered 2022-04-09: 20 mg via INTRAVENOUS

## 2022-04-09 MED ORDER — KETAMINE HCL 50 MG/5ML IJ SOSY
PREFILLED_SYRINGE | INTRAMUSCULAR | Status: AC
  Filled 2022-04-09: qty 5

## 2022-04-09 MED ORDER — SUGAMMADEX SODIUM 200 MG/2ML IV SOLN
INTRAVENOUS | Status: DC | PRN
  Administered 2022-04-09: 200 mg via INTRAVENOUS

## 2022-04-09 MED ORDER — MIDAZOLAM HCL 2 MG/2ML IJ SOLN
INTRAMUSCULAR | Status: AC
  Filled 2022-04-09: qty 2

## 2022-04-09 MED ORDER — PROPOFOL 10 MG/ML IV BOLUS
INTRAVENOUS | Status: DC | PRN
  Administered 2022-04-09: 200 mg via INTRAVENOUS

## 2022-04-09 MED ORDER — POTASSIUM CHLORIDE CRYS ER 20 MEQ PO TBCR: 40.0000 meq | EXTENDED_RELEASE_TABLET | Freq: Once | ORAL | Status: DC

## 2022-04-09 MED ORDER — SODIUM CHLORIDE 0.9 % IV SOLN
3.0000 g | Freq: Four times a day (QID) | INTRAVENOUS | Status: DC
  Administered 2022-04-09 – 2022-04-11 (×8): 3 g via INTRAVENOUS
  Filled 2022-04-09 (×6): qty 3
  Filled 2022-04-09: qty 8
  Filled 2022-04-09 (×2): qty 3

## 2022-04-09 MED ORDER — DEXAMETHASONE SODIUM PHOSPHATE 10 MG/ML IJ SOLN
10.0000 mg | Freq: Once | INTRAMUSCULAR | Status: AC
  Administered 2022-04-09: 10 mg via INTRAVENOUS
  Filled 2022-04-09: qty 1

## 2022-04-09 MED ORDER — KETAMINE HCL 10 MG/ML IJ SOLN
INTRAMUSCULAR | Status: DC | PRN
  Administered 2022-04-09 (×3): 10 mg via INTRAVENOUS

## 2022-04-09 MED ORDER — KETOROLAC TROMETHAMINE 30 MG/ML IJ SOLN
15.0000 mg | Freq: Once | INTRAMUSCULAR | Status: AC
  Administered 2022-04-09: 15 mg via INTRAVENOUS
  Filled 2022-04-09: qty 1

## 2022-04-09 MED ORDER — SUCCINYLCHOLINE CHLORIDE 200 MG/10ML IV SOSY
PREFILLED_SYRINGE | INTRAVENOUS | Status: DC | PRN
  Administered 2022-04-09: 100 mg via INTRAVENOUS

## 2022-04-09 MED ORDER — LACTATED RINGERS IV BOLUS
1000.0000 mL | Freq: Once | INTRAVENOUS | Status: AC
  Administered 2022-04-09: 1000 mL via INTRAVENOUS

## 2022-04-09 MED ORDER — ALBUTEROL SULFATE (2.5 MG/3ML) 0.083% IN NEBU
2.5000 mg | INHALATION_SOLUTION | Freq: Two times a day (BID) | RESPIRATORY_TRACT | Status: DC
  Administered 2022-04-09 – 2022-04-11 (×4): 2.5 mg via RESPIRATORY_TRACT
  Filled 2022-04-09 (×4): qty 3

## 2022-04-09 MED ORDER — GLYCOPYRROLATE 0.2 MG/ML IJ SOLN
INTRAMUSCULAR | Status: DC | PRN
  Administered 2022-04-09: .2 mg via INTRAVENOUS

## 2022-04-09 MED ORDER — LIDOCAINE HCL (CARDIAC) PF 100 MG/5ML IV SOSY
PREFILLED_SYRINGE | INTRAVENOUS | Status: DC | PRN
  Administered 2022-04-09: 100 mg via INTRAVENOUS

## 2022-04-09 MED ORDER — IOHEXOL 300 MG/ML  SOLN
75.0000 mL | Freq: Once | INTRAMUSCULAR | Status: AC | PRN
  Administered 2022-04-09: 75 mL via INTRAVENOUS

## 2022-04-09 MED ORDER — HYDROMORPHONE HCL 1 MG/ML IJ SOLN
1.0000 mg | INTRAMUSCULAR | Status: DC | PRN
  Administered 2022-04-09 – 2022-04-10 (×3): 1 mg via INTRAVENOUS
  Filled 2022-04-09 (×3): qty 1

## 2022-04-09 MED ORDER — FENTANYL CITRATE (PF) 100 MCG/2ML IJ SOLN: 25.0000 ug | INTRAMUSCULAR | Status: DC | PRN

## 2022-04-09 MED ORDER — ONDANSETRON HCL 4 MG/2ML IJ SOLN
INTRAMUSCULAR | Status: DC | PRN
  Administered 2022-04-09: 4 mg via INTRAVENOUS

## 2022-04-09 MED ORDER — OXYCODONE HCL 5 MG/5ML PO SOLN: 5.0000 mg | Freq: Once | ORAL | Status: DC | PRN

## 2022-04-09 MED ORDER — ACETAMINOPHEN 325 MG PO TABS: 650.0000 mg | ORAL_TABLET | Freq: Four times a day (QID) | ORAL | Status: DC | PRN

## 2022-04-09 MED ORDER — LACTATED RINGERS IV SOLN: INTRAVENOUS | Status: DC | PRN

## 2022-04-09 MED ORDER — LIDOCAINE HCL (PF) 2 % IJ SOLN
INTRAMUSCULAR | Status: AC
  Filled 2022-04-09: qty 5

## 2022-04-09 MED ORDER — FENTANYL CITRATE (PF) 100 MCG/2ML IJ SOLN
INTRAMUSCULAR | Status: DC | PRN
  Administered 2022-04-09 (×2): 50 ug via INTRAVENOUS

## 2022-04-09 MED ORDER — DEXMEDETOMIDINE HCL IN NACL 80 MCG/20ML IV SOLN
INTRAVENOUS | Status: DC | PRN
  Administered 2022-04-09 (×2): 4 ug via BUCCAL

## 2022-04-09 MED ORDER — ALBUTEROL SULFATE (2.5 MG/3ML) 0.083% IN NEBU
2.5000 mg | INHALATION_SOLUTION | RESPIRATORY_TRACT | Status: DC
  Administered 2022-04-09: 6 mg via RESPIRATORY_TRACT
  Administered 2022-04-09: 2.5 mg via RESPIRATORY_TRACT
  Filled 2022-04-09: qty 3

## 2022-04-09 MED ORDER — LIDOCAINE-EPINEPHRINE 1 %-1:100000 IJ SOLN
INTRAMUSCULAR | Status: AC
  Filled 2022-04-09: qty 1

## 2022-04-09 MED ORDER — MIDAZOLAM HCL 2 MG/2ML IJ SOLN
INTRAMUSCULAR | Status: DC | PRN
  Administered 2022-04-09: 2 mg via INTRAVENOUS

## 2022-04-09 MED ORDER — CHLORHEXIDINE GLUCONATE 0.12 % MT SOLN
15.0000 mL | Freq: Four times a day (QID) | OROMUCOSAL | Status: DC
  Administered 2022-04-09 – 2022-04-11 (×7): 15 mL via OROMUCOSAL
  Filled 2022-04-09 (×7): qty 15

## 2022-04-09 MED ORDER — DEXAMETHASONE SODIUM PHOSPHATE 10 MG/ML IJ SOLN
10.0000 mg | Freq: Three times a day (TID) | INTRAMUSCULAR | Status: DC
  Administered 2022-04-09 – 2022-04-11 (×5): 10 mg via INTRAVENOUS
  Filled 2022-04-09 (×5): qty 1

## 2022-04-09 MED ORDER — OXYCODONE-ACETAMINOPHEN 5-325 MG PO TABS
1.0000 | ORAL_TABLET | ORAL | Status: DC | PRN
  Administered 2022-04-09 – 2022-04-11 (×7): 1 via ORAL
  Filled 2022-04-09 (×7): qty 1

## 2022-04-09 MED ORDER — PROPOFOL 10 MG/ML IV BOLUS
INTRAVENOUS | Status: AC
  Filled 2022-04-09: qty 40

## 2022-04-09 SURGICAL SUPPLY — 33 items
ATTRACTOMAT 16X20 MAGNETIC DRP (DRAPES) ×1 IMPLANT
BLADE SURG 15 STRL LF DISP TIS (BLADE) ×2 IMPLANT
BLADE SURG 15 STRL SS (BLADE) ×2
CORD BIP STRL DISP 12FT (MISCELLANEOUS) ×1 IMPLANT
DERMABOND ADVANCED .7 DNX12 (GAUZE/BANDAGES/DRESSINGS) ×1 IMPLANT
DRAIN PENROSE 12X.25 LTX STRL (MISCELLANEOUS) ×1 IMPLANT
ELECT CAUTERY BLADE TIP 2.5 (TIP) ×1
ELECT REM PT RETURN 9FT ADLT (ELECTROSURGICAL) ×1
ELECTRODE CAUTERY BLDE TIP 2.5 (TIP) ×1 IMPLANT
ELECTRODE REM PT RTRN 9FT ADLT (ELECTROSURGICAL) ×1 IMPLANT
FORCEPS JEWEL BIP 4-3/4 STR (INSTRUMENTS) ×1 IMPLANT
GAUZE 4X4 16PLY ~~LOC~~+RFID DBL (SPONGE) ×1 IMPLANT
GLOVE BIO SURGEON STRL SZ7.5 (GLOVE) ×1 IMPLANT
GOWN STRL REUS W/ TWL LRG LVL3 (GOWN DISPOSABLE) ×2 IMPLANT
GOWN STRL REUS W/TWL LRG LVL3 (GOWN DISPOSABLE) ×2
KIT TURNOVER KIT A (KITS) ×1 IMPLANT
LABEL OR SOLS (LABEL) ×1 IMPLANT
MANIFOLD NEPTUNE II (INSTRUMENTS) ×1 IMPLANT
NDL HYPO 25GX1X1/2 BEV (NEEDLE) ×1 IMPLANT
NEEDLE HYPO 25GX1X1/2 BEV (NEEDLE) ×1 IMPLANT
NS IRRIG 500ML POUR BTL (IV SOLUTION) ×1 IMPLANT
PACK HEAD/NECK (MISCELLANEOUS) ×1 IMPLANT
STAPLER SKIN PROX 35W (STAPLE) ×1 IMPLANT
SUCTION FRAZIER HANDLE 10FR (MISCELLANEOUS) ×1
SUCTION TUBE FRAZIER 10FR DISP (MISCELLANEOUS) ×1 IMPLANT
SUT SILK 2 0 (SUTURE)
SUT SILK 2-0 18XBRD TIE 12 (SUTURE) ×2 IMPLANT
SUT VIC AB 4-0 RB1 18 (SUTURE) ×1 IMPLANT
SWAB CULTURE AMIES ANAERIB BLU (MISCELLANEOUS) ×1 IMPLANT
SYR 10ML LL (SYRINGE) ×1 IMPLANT
SYR BULB EAR ULCER 3OZ GRN STR (SYRINGE) ×1 IMPLANT
TRAP FLUID SMOKE EVACUATOR (MISCELLANEOUS) ×1 IMPLANT
WATER STERILE IRR 500ML POUR (IV SOLUTION) ×1 IMPLANT

## 2022-04-09 NOTE — Plan of Care (Signed)
  Problem: Education: Goal: Knowledge of General Education information will improve Description: Including pain rating scale, medication(s)/side effects and non-pharmacologic comfort measures Outcome: Progressing   Problem: Clinical Measurements: Goal: Will remain free from infection Outcome: Progressing   Problem: Clinical Measurements: Goal: Respiratory complications will improve Outcome: Progressing   Problem: Pain Managment: Goal: General experience of comfort will improve Outcome: Progressing   Problem: Safety: Goal: Ability to remain free from injury will improve Outcome: Progressing   Problem: Elimination: Goal: Will not experience complications related to urinary retention Outcome: Progressing

## 2022-04-09 NOTE — Op Note (Signed)
....  04/09/2022  1:06 PM    [name]  [id number]   Pre-Op Dx:  Neck abscess, odontogenic abscess, dental caries  Post-op Dx: same  Proc: Trans-oral incision and drainage of dental/submandibular abscess  Surg:  David Burch  Anes:  GOT  EBL:  42ml  Comp:  none  Findings:  Large cavity of 63ml of purulent fluid opened lateral to left mandibular molars with extension into submandibular space.  Procedure: After the patient was identified in holding and the history and physical and consent was reviewed, the patient was taken to the operating room and placed in a supine position. General endotracheal anesthesia was induced in the normal fashion with a McGraff scope due to patient's trismus.  At this time, the patient was rotated 30 degrees and a tonsil gag was placed into the patient's oral cavity.  This demonstrated widening of the mandible posteriorly on the left with active purulent drainage adjacent to 2nd mandibular molar.  Palpation of this region revealed induration and some fluctuance.  4 ml of lidocaine with 1:100,000 epinephrine was injected into the patient's left gingival and buccal space.  Using a 15 blade scalpel, an incision parallel with the mandible was made adjacent to his two posterior molars.  Frank purulence was noted.  Using a tonsil clamp, the abscess cavity was opened and internal septations broken up.  This was continued anteriorly and inferiorly to the submandibular space.  Palpation of the external neck and submental region as well as left retromandibular region was used to express purulence from the wound.  The incision was opened posteriorly approxmiately 1 cm to ensure including more posterior aspect of the abscess cavity.  Dissection with the tonsil clamp was made along the bone posteriorly until all purulent drainage had been expressed and suctioned and no longer any palpation of the external neck resulted in purulence seen in abscess cavity.  At this  time, sterile saline was used to copiously irrigate the wound and all purulence was removed.  Iodoform packing was placed into the wound bed at this time and removed and noted to be clean in nature.  No packing was left in the wound given large opening that was created and posterior nature of the abscess opening.  The patient's entire airway was next evaluated for hemostasis and meticulous suctioning was performed. The wound was irrigated once more and noted to be free from purulence.  At this time, care of the patient was transferred to anesthesia.   Dispo:   To PACU in good condition  Plan:  Soft Diet, Continue IV antibiotics and IV steroids and pain control.  Follow culture results.  Will continue to follow.  David Burch  04/09/2022 1:06 PM

## 2022-04-09 NOTE — Transfer of Care (Signed)
Immediate Anesthesia Transfer of Care Note  Patient: David Burch  Procedure(s) Performed: INCISION AND DRAINAGE NECK ABSCESS (Neck)  Patient Location: PACU  Anesthesia Type:General  Level of Consciousness: awake  Airway & Oxygen Therapy: Patient Spontanous Breathing and Patient connected to face mask oxygen  Post-op Assessment: Report given to RN and Post -op Vital signs reviewed and stable  Post vital signs: Reviewed  Last Vitals:  Vitals Value Taken Time  BP 119/63 04/09/22 1322  Temp 36.9 C 04/09/22 1322  Pulse 72 04/09/22 1329  Resp 14 04/09/22 1329  SpO2 98 % 04/09/22 1329  Vitals shown include unvalidated device data.  Last Pain:  Vitals:   04/09/22 1322  TempSrc:   PainSc: Asleep         Complications: No notable events documented.

## 2022-04-09 NOTE — Discharge Summary (Signed)
Physician Discharge Summary   Patient: David Burch MRN: [id number] DOB: 12/19/1980  Admit date:     04/06/2022  Discharge date: 04/07/2022  Discharge Physician: Max Sane   PCP: Pcp, No   Recommendations at discharge:    F/up with oral surgeon/dentist ASAP -  I myself had called to to get him emergency appointment - he was made appointment with Dr Cristy Friedlander [facility] Oral surgery) @ [place] (as none available locally in near future) for March 20th @ 2 pm. Patient at first agreed but when they requested $100 for copay - he refused and decided to leave AMA. He was explained the risks (worsening of his tooth abscess-sepsis) associated with this including clear instructions from ENT for at least 48 hrs of IV Abx, steroids but he refused to stay and left AMA.  Discharge Diagnoses: Principal Problem:   Dental abscess Active Problems:   Tooth abscess   [name]  Hospital Course: Assessment and Plan: * Dental abscess [name] Treated with IV Unasyn and Decadron Pain meds while in the hospital - I declined to give him Narcotics as he decided to leave AMA - he's not very happy about this. I still sent script for Abx (doubt he will pick up) - ENT seen and recommended at least 48 hrs of IV Abx and steroids. They also recommended oral surgery f/up soon possible to have his tooth removed ASAP. I myself called all local practices and got him an appointment for oral surgeon at [place]. Patient acknowledged it and confirmed with me that he'll go to his scheduled appointment. Later, when he learned about co-pay, he refused and left AMA saying he doesn't have any money.         Consultants: ENT Disposition: Home Diet recommendation:  Carb modified diet DISCHARGE MEDICATION: Allergies as of 04/07/2022   No Known Allergies      Medication List     ASK your doctor about these medications    cyclobenzaprine 10 MG tablet Commonly known as: FLEXERIL Take 1 tablet (10  mg total) by mouth 3 (three) times daily as needed.   HYDROcodone-acetaminophen 5-325 MG tablet Commonly known as: Norco Take 1 tablet by mouth every 6 (six) hours as needed for moderate pain or severe pain.   ibuprofen 600 MG tablet Commonly known as: ADVIL Take 1 tablet (600 mg total) by mouth every 8 (eight) hours as needed.   ondansetron 4 MG disintegrating tablet Commonly known as: Zofran ODT Allow 1-2 tablets to dissolve in your mouth every 8 hours as needed for nausea/vomiting   ondansetron 4 MG disintegrating tablet Commonly known as: Zofran ODT Allow 1-2 tablets to dissolve in your mouth every 8 hours as needed for nausea/vomiting   ondansetron 4 MG disintegrating tablet Commonly known as: ZOFRAN-ODT Take 1 tablet (4 mg total) by mouth every 8 (eight) hours as needed.   sulfamethoxazole-trimethoprim 800-160 MG tablet Commonly known as: BACTRIM DS Take 1 tablet by mouth 2 (two) times daily.   tamsulosin 0.4 MG Caps capsule Commonly known as: FLOMAX Take 1 tablet by mouth daily until you pass the kidney stone or no longer have symptoms        Follow-up Information     Cristy Friedlander II, DDS. Go on 04/09/2022.   Specialty: Oral Surgery Why: Appointment on 04/09/22 @ 2:00 pm Contact information: [address] [place] [address] [phone]                Discharge Exam: [name]  Weights   04/06/22 1836 04/07/22 0118  Weight: 104.3 kg 104 kg   Constitutional:      General: He is not in acute distress. HENT:     Head: Normocephalic and atraumatic.     Mouth/Throat:     Comments: Large swelling to left side of jaw, could open mouth about 1-1.5 cm the most, but no drooling and able to speak fluidly. Swollen neck lymphnodes Cardiovascular:     Rate and Rhythm: Normal rate and regular rhythm.     Heart sounds: Normal heart sounds.  Pulmonary:     Effort: Pulmonary effort is normal.     Breath sounds: Normal breath sounds.  Abdominal:     Palpations:  Abdomen is soft.     Tenderness: There is no abdominal tenderness.  Neurological: non-focal  Condition at discharge: poor  The results of significant diagnostics from this hospitalization (including imaging, microbiology, ancillary and laboratory) are listed below for reference.   Imaging Studies: CT Soft Tissue Neck W Contrast  Result Date: 04/09/2022 CLINICAL DATA:  42 year old male with increasing left neck and face abscess status post consultation by ENT and IV antibiotics but left AMA. Returns with increased swelling. EXAM: CT NECK WITH CONTRAST TECHNIQUE: Multidetector CT imaging of the neck was performed using the standard protocol following the bolus administration of intravenous contrast. RADIATION DOSE REDUCTION: This exam was performed according to the departmental dose-optimization program which includes automated exposure control, adjustment of the mA and/or kV according to patient size and/or use of iterative reconstruction technique. CONTRAST:  51mL OMNIPAQUE IOHEXOL 300 MG/ML  SOLN COMPARISON:  Neck CT 04/06/2022. FINDINGS: Pharynx and larynx: Progressive inflammation throughout the left parapharyngeal space now. Associated swelling along the left lateral pharynx. Other pharyngeal soft tissue contours remain normal. No pharyngeal mucosal space hyperenhancement. Right parapharyngeal space, retropharyngeal space, and larynx remain within normal limits. Salivary glands: Bulky left submandibular and posterior sublingual space abscess along the undersurface of the posterior left mandible body (series 3, image 48) now encompasses about 33 x 25 by 46 mm (AP by transverse by CC) and appears mildly larger since 04/06/2022 (sagittal image 118 today). Estimated pus volume 19 mL. Multi spatial surrounding soft tissue swelling and stranding has increased, with new secondary inflammation of the left parotid space and increased left masticator space involvement. Mass effect and secondary inflammation  affecting the left submandibular gland appears increased. There is mild rightward mass effect on the central sublingual space which otherwise appears to remain normal on series 3, image 48. Contralateral right submandibular and parotid glands remain normal. Thyroid: Inflammation tracks anterior to the thyroid in the midline lower neck now. Otherwise negative. Lymph nodes: Reactive left level 1 lymph nodes persist up to 9 mm short axis. Smaller reactive left level 2 and level 3 nodes. No lymph node suppuration identified. Vascular: Major vascular structures in the neck and at the skull base remain patent including the left IJ. Limited intracranial: Negative. Visualized orbits: Minimally included. Mastoids and visualized paranasal sinuses: Visualized paranasal sinuses and mastoids are stable and well aerated. Skeleton: Redemonstrated cortical breakthrough from carious anterior residual left mandible molar associated periapical lucency, contiguous with the subperiosteal abscess on sagittal image 115. No other mandible cortical erosion. Adjacent wisdom tooth is present. No other No acute osseous abnormality identified. Upper chest: No superior mediastinal inflammation or lymphadenopathy. Mild respiratory motion, visible upper lungs remain clear. IMPRESSION: 1. Substantial progression of left face and neck Cellulitis surrounding unresolved and mildly larger Left submandibular odontogenic Abscess (estimated  19 mL). Carious left mandible molar there with periapical dehiscent cortex. 2. Increased secondary inflammation throughout the soft tissue spaces of the left face and neck, relatively sparing the central sublingual space. Increased left submandibular gland mass effect and inflammation. Reactive regional lymph nodes. Electronically Signed   By: Genevie Ann M.D.   On: 04/09/2022 07:44   CT Soft Tissue Neck W Contrast  Result Date: 04/06/2022 CLINICAL DATA:  Dental pain EXAM: CT NECK WITH CONTRAST TECHNIQUE: Multidetector  CT imaging of the neck was performed using the standard protocol following the bolus administration of intravenous contrast. RADIATION DOSE REDUCTION: This exam was performed according to the departmental dose-optimization program which includes automated exposure control, adjustment of the mA and/or kV according to patient size and/or use of iterative reconstruction technique. CONTRAST:  35mL OMNIPAQUE IOHEXOL 300 MG/ML  SOLN COMPARISON:  None Available. FINDINGS: Pharynx and larynx: There is an abscess at the left retromolar trigone measuring 2.1 x 2.5 cm. This arises from a periapical lucency of the left second mandibular molar (tooth 22). The left platysma is thickened and there is inflammatory change extending into the left face subcutaneous tissues and the left submandibular space. Salivary glands: Reactive enlargement of the left submandibular gland. Thyroid: Normal. Lymph nodes: Reactive left submandibular lymphadenopathy. Vascular: Negative. Limited intracranial: Negative. Visualized orbits: Negative. Mastoids and visualized paranasal sinuses: Clear. Skeleton: No acute or aggressive process. Upper chest: Negative. Other: None. IMPRESSION: A 2.1 x 2.5 cm abscess at the left retromolar trigone, arising from a periapical lucency of the left second mandibular molar (tooth 22). Electronically Signed   By: Ulyses Jarred M.D.   On: 04/06/2022 21:32   DG Chest 2 View  Result Date: 04/06/2022 CLINICAL DATA:  Pneumonia EXAM: CHEST - 2 VIEW COMPARISON:  08/23/2021 FINDINGS: Lungs are well expanded, symmetric, and clear. No pneumothorax or pleural effusion. Cardiac size within normal limits. Pulmonary vascularity is normal. Remote healed left clavicle fracture. Osseous structures are age-appropriate. No acute bone abnormality. IMPRESSION: No active cardiopulmonary disease. Electronically Signed   By: Fidela Salisbury M.D.   On: 04/06/2022 19:31    Microbiology: No results found for this or any previous  visit.  Labs: CBC: Recent Labs  Lab 04/02/22 1807 04/06/22 1840 04/07/22 0500 04/09/22 0619  WBC 11.9* 14.1* 11.1* 18.0*  NEUTROABS 7.9* 10.0*  --  13.9*  HGB 15.3 15.8 15.0 14.1  HCT 46.3 47.0 45.5 42.3  MCV 94.3 91.3 92.3 92.6  PLT 284 298 307 [id number]   Basic Metabolic Panel: Recent Labs  Lab 04/02/22 1807 04/06/22 1840 04/07/22 0500 04/09/22 0619 04/09/22 0847  NA 136 131* 136 133*  --   K 4.6 3.9 4.4 3.4*  --   CL 100 98 102 100  --   CO2 28 22 26 26   --   GLUCOSE 101* 108* 173* 130*  --   BUN 14 14 13 15   --   CREATININE 0.90 0.80 0.83 0.90  --   CALCIUM 9.1 9.2 8.9 8.7*  --   MG  --   --   --   --  2.0   Liver Function Tests: Recent Labs  Lab 04/02/22 1807 04/06/22 1840  AST 16 15  ALT 16 13  ALKPHOS 60 68  BILITOT 0.8 1.0  PROT 7.9 8.4*  ALBUMIN 4.3 4.2   CBG: No results for input(s): "GLUCAP" in the last 168 hours.  Discharge time spent: greater than 30 minutes.  Signed: Max Sane, MD [facility] 04/09/2022

## 2022-04-09 NOTE — Progress Notes (Signed)
..04/09/2022 5:49 PM  [name] [id number]  Post-Op Day 0    Temp:  [97.9 F (36.6 C)-98.8 F (37.1 C)] 98.1 F (36.7 C) (03/20 1618) Pulse Rate:  [61-95] 61 (03/20 1618) Resp:  [11-22] 18 (03/20 1618) BP: (90-128)/(54-78) 101/54 (03/20 1618) SpO2:  [91 %-99 %] 94 % (03/20 1618) Weight:  [101.2 kg] 101.2 kg (03/20 1122),     Intake/Output Summary (Last 24 hours) at 04/09/2022 1749 Last data filed at 04/09/2022 1500 Gross per 24 hour  Intake 3205.65 ml  Output 500 ml  Net 2705.65 ml    Results for orders placed or performed during the hospital encounter of 04/09/22 (from the past 24 hour(s))  Procalcitonin     Status: None   Collection Time: 04/09/22  6:19 AM  Result Value Ref Range   Procalcitonin <0.10 ng/mL  Lactic acid, plasma     Status: None   Collection Time: 04/09/22  6:19 AM  Result Value Ref Range   Lactic Acid, Venous 0.8 0.5 - 1.9 mmol/L  CBC with Differential/Platelet     Status: Abnormal   Collection Time: 04/09/22  6:19 AM  Result Value Ref Range   WBC 18.0 (H) 4.0 - 10.5 K/uL   RBC 4.57 4.22 - 5.81 MIL/uL   Hemoglobin 14.1 13.0 - 17.0 g/dL   HCT 42.3 39.0 - 52.0 %   MCV 92.6 80.0 - 100.0 fL   MCH 30.9 26.0 - 34.0 pg   MCHC 33.3 30.0 - 36.0 g/dL   RDW 12.6 11.5 - 15.5 %   Platelets 295 150 - 400 K/uL   nRBC 0.0 0.0 - 0.2 %   Neutrophils Relative % 77 %   Neutro Abs 13.9 (H) 1.7 - 7.7 K/uL   Lymphocytes Relative 11 %   Lymphs Abs 1.9 0.7 - 4.0 K/uL   Monocytes Relative 11 %   Monocytes Absolute 2.0 (H) 0.1 - 1.0 K/uL   Eosinophils Relative 0 %   Eosinophils Absolute 0.1 0.0 - 0.5 K/uL   Basophils Relative 0 %   Basophils Absolute 0.1 0.0 - 0.1 K/uL   Immature Granulocytes 1 %   Abs Immature Granulocytes 0.13 (H) 0.00 - 0.07 K/uL  Basic metabolic panel     Status: Abnormal   Collection Time: 04/09/22  6:19 AM  Result Value Ref Range   Sodium 133 (L) 135 - 145 mmol/L   Potassium 3.4 (L) 3.5 - 5.1 mmol/L   Chloride 100 98 - 111 mmol/L    CO2 26 22 - 32 mmol/L   Glucose, Bld 130 (H) 70 - 99 mg/dL   BUN 15 6 - 20 mg/dL   Creatinine, Ser 0.90 0.61 - 1.24 mg/dL   Calcium 8.7 (L) 8.9 - 10.3 mg/dL   GFR, Estimated >60 >60 mL/min   Anion gap 7 5 - 15  Lactic acid, plasma     Status: None   Collection Time: 04/09/22  8:00 AM  Result Value Ref Range   Lactic Acid, Venous 0.7 0.5 - 1.9 mmol/L  Protime-INR     Status: None   Collection Time: 04/09/22  8:47 AM  Result Value Ref Range   Prothrombin Time 14.4 11.4 - 15.2 seconds   INR 1.1 0.8 - 1.2  APTT     Status: None   Collection Time: 04/09/22  8:47 AM  Result Value Ref Range   aPTT 33 24 - 36 seconds  Magnesium     Status: None   Collection Time: 04/09/22  8:47 AM  Result Value Ref Range   Magnesium 2.0 1.7 - 2.4 mg/dL  Sedimentation rate     Status: Abnormal   Collection Time: 04/09/22  8:47 AM  Result Value Ref Range   Sed Rate 17 (H) 0 - 15 mm/hr    SUBJECTIVE:  No acute events since procedure.  Patient reports improved pain and discomfort.  No breathing issues.  Some drainage.  OBJECTIVE:  GEN-  Supine in bed NAD OC/OP-  edema and erythema of mandible and widening, no purulence seen currently FACE-  left malar and mandibular edema and erythema, improved tenderness and firmness  IMPRESSION:  Odontogenic abscess s/p trans-oral incision and drainage  PLAN:  Will add Chlorhexidine rinse.  Continue soft diet.  Continue IV antibiotics and steroids and pain control.  Will re-evaluate in a.m.  David Burch 04/09/2022, 5:49 PM

## 2022-04-09 NOTE — ED Triage Notes (Signed)
Patient ambulatory to triage with steady gait, without difficulty or distress noted; pt seen 3/13 after being rx amoxi then clindamycin by dentist; dx left mandibular dental abscess; returned 3/17 and was admitted for [place] abscess, seen ENT, had drainage and IV antibiotics but left AMA; returns for increasing swelling; large amount swelling noted to left side face; pt st that he seen dentist yesterday and has appt Thursday am for surgery

## 2022-04-09 NOTE — Anesthesia Preprocedure Evaluation (Signed)
Anesthesia Evaluation  Patient identified by MRN, date of birth, ID band Patient awake    Reviewed: Allergy & Precautions, NPO status , Patient's Chart, lab work & pertinent test results  Airway Mallampati: IV  TM Distance: >3 FB Neck ROM: full  Mouth opening: Limited Mouth Opening  Dental  (+) Dental Advidsory Given, Poor Dentition   Pulmonary neg pulmonary ROS, Current Smoker and Patient abstained from smoking.   Pulmonary exam normal        Cardiovascular negative cardio ROS Normal cardiovascular exam     Neuro/Psych negative neurological ROS  negative psych ROS   GI/Hepatic negative GI ROS, Neg liver ROS,,,  Endo/Other  negative endocrine ROS    Renal/GU      Musculoskeletal   Abdominal   Peds  Hematology negative hematology ROS (+)   Anesthesia Other Findings Patient presents with a swollen cheek and states that the pain is not letting him open his mouth more than a few cm. States that he has mild asthma, and had a breathing treatment in the ED for wheezing earlier this morning. Denies any shortness of breath or chest pain. Discussed the possibility of post op intubation until the patient can show that he is awake enough to breathe by himself. Patient stated he understood and agreed.   Past Medical History: No date: Bipolar 1 disorder (HCC) No date: PTSD (post-traumatic stress disorder) No date: Tobacco abuse  Past Surgical History: No date: ORTHOPEDIC SURGERY  BMI    Body Mass Index: 34.93 kg/m      Reproductive/Obstetrics negative OB ROS                             Anesthesia Physical Anesthesia Plan  ASA: 2 and emergent  Anesthesia Plan: General ETT   Post-op Pain Management:    Induction: Intravenous  PONV Risk Score and Plan: 3 and Dexamethasone, Ondansetron and Midazolam  Airway Management Planned: Oral ETT  Additional Equipment:   Intra-op Plan:    Post-operative Plan: Extubation in OR  Informed Consent: I have reviewed the patients History and Physical, chart, labs and discussed the procedure including the risks, benefits and alternatives for the proposed anesthesia with the patient or authorized representative who has indicated his/her understanding and acceptance.     Dental Advisory Given  Plan Discussed with: Anesthesiologist, CRNA and Surgeon  Anesthesia Plan Comments: (Patient consented for risks of anesthesia including but not limited to:  - adverse reactions to medications - damage to eyes, teeth, lips or other oral mucosa - nerve damage due to positioning  - sore throat or hoarseness - Damage to heart, brain, nerves, lungs, other parts of body or loss of life  Patient voiced understanding.)       Anesthesia Quick Evaluation

## 2022-04-09 NOTE — TOC Initial Note (Signed)
Transition of Care [facility]) - Initial/Assessment Note    Patient Details  Name: David Burch MRN: [id number] Date of Birth: 03/05/1980  Transition of Care [facility]) CM/SW Contact:    Beverly Sessions, RN Phone Number: 04/09/2022, 3:12 PM  Clinical Narrative:                     Transition of Care [facility]) Screening Note   Patient Details  Name: David Burch Date of Birth: 01/18/1981   Transition of Care [facility]) CM/SW Contact:    Beverly Sessions, RN Phone Number: 04/09/2022, 3:12 PM    Transition of Care Department [facility]) has reviewed patient and no TOC needs have been identified at this time. We will continue to monitor patient advancement through interdisciplinary progression rounds. If new patient transition needs arise, please place a TOC consult.       Patient Goals and CMS Choice            Expected Discharge Plan and Services                                              Prior Living Arrangements/Services                       Activities of Daily Living Home Assistive Devices/Equipment: None ADL Screening (condition at time of admission) Patient's cognitive ability adequate to safely complete daily activities?: Yes Is the patient deaf or have difficulty hearing?: No Does the patient have difficulty seeing, even when wearing glasses/contacts?: No Does the patient have difficulty concentrating, remembering, or making decisions?: No Patient able to express need for assistance with ADLs?: Yes Does the patient have difficulty dressing or bathing?: No Independently performs ADLs?: Yes (appropriate for developmental age) Does the patient have difficulty walking or climbing stairs?: No Weakness of Legs: None Weakness of Arms/Hands: None  Permission Sought/Granted                  Emotional Assessment              Admission diagnosis:  Dental abscess [K04.7] Angina, Ludwig [K12.2] Patient Active Problem List   Diagnosis Date  Noted   Facial cellulitis 04/09/2022   Sepsis ([place]) 04/09/2022   Obesity (BMI 30-39.9) 04/09/2022   Hypokalemia 04/09/2022   Dental abscess 04/07/2022   Tooth abscess 04/07/2022   [name] 04/07/2022   Bipolar 1 disorder ([place]) 04/06/2022   PCP:  Merryl Hacker No Pharmacy:   [facility] [address]), [place] [address] [place]) [place] [address] Phone: [phone] Fax: [phone]     Social Determinants of Health (SDOH) Social History: SDOH Screenings   Food Insecurity: No Food Insecurity (04/09/2022)  Housing: Low Risk  (04/09/2022)  Transportation Needs: No Transportation Needs (04/09/2022)  Utilities: Not At Risk (04/09/2022)  Tobacco Use: High Risk (04/09/2022)   SDOH Interventions:     Readmission Risk Interventions     No data to display

## 2022-04-09 NOTE — H&P (Signed)
History and Physical    David Burch HBZ:[id number] DOB: 1980-03-29 DOA: 04/09/2022  Referring MD/NP/PA:   PCP: Pcp, No   Patient coming from:  The patient is coming from home.    Chief Complaint: Left facial pain and dental abscess  HPI: [name] is a 42 y.o. male with medical history significant of bipolar, PTSD, tobacco abuse, who presents with left facial pain and dental abscess.  Patient was admitted to hospital on 3/17 due to dental abscess.  Patient was treated with Unasyn and Decadron.  ENT was consulted, no procedure needed per Dr. Ayesha Mohair of ENT. Pt left hospital on [place] on 3/18. Pt was seen by oral surgery yesterday and scheduled for procedure on Thursday 3/21. Due to worsening left facial pain and swelling, patient comes back to the hospital.  The pain is constant, severe, sharp, nonradiating.  Patient does not have fever and chills.  Patient has mild dry cough, no chest pain or shortness breath. No nausea vomiting, diarrhea or abdominal pain.  No symptoms of UTI .   Data reviewed independently and ED Course: pt was found to have WBC 18.0, potassium 3.4, GFR> 60, temperature normal, blood pressure 128/75, heart rate 95, RR 22, oxygen saturation 88% initially, currently 95% on room air.  Patient is admitted to [facility] bed as inpatient.  Dr. Pryor Ochoa of ENT is consulted.   CT-neck soft tissue 1. Substantial progression of left face and neck Cellulitis surrounding unresolved and mildly larger Left submandibular odontogenic Abscess (estimated 19 mL). Carious left mandible molar there with periapical dehiscent cortex.   2. Increased secondary inflammation throughout the soft tissue spaces of the left face and neck, relatively sparing the central sublingual space. Increased left submandibular gland mass effect and inflammation. Reactive regional lymph nodes.  EKG: I have personally reviewed.  Sinus rhythm, QTc 415, RAD, no ischemic change.   Review of Systems:   General:  no fevers, chills, no body weight gain, has fatigue, left facial pain and swelling. Picture was taken by EDP.    HEENT: no blurry vision, hearing changes or sore throat Respiratory: no dyspnea, coughing, wheezing CV: no chest pain, no palpitations GI: no nausea, vomiting, abdominal pain, diarrhea, constipation GU: no dysuria, burning on urination, increased urinary frequency, hematuria  Ext: no leg edema Neuro: no unilateral weakness, numbness, or tingling, no vision change or hearing loss Skin: no rash, no skin tear. MSK: No muscle spasm, no deformity, no limitation of range of movement in spin Heme: No easy bruising.  Travel history: No recent long distant travel.   Allergy:  Allergies  Allergen Reactions   Tramadol     Muscle Spasms    Past Medical History:  Diagnosis Date   Bipolar 1 disorder ([place])    PTSD (post-traumatic stress disorder)    Tobacco abuse     Past Surgical History:  Procedure Laterality Date   ORTHOPEDIC SURGERY      Social History:  reports that he has been smoking. He has been smoking an average of .5 packs per day. He has quit using smokeless tobacco. He reports that he does not currently use alcohol. He reports that he does not currently use drugs.  Family History:  Family History  Problem Relation Age of Onset   Heart failure Mother    Diabetes Father    Cirrhosis Father      Prior to Admission medications   Medication Sig Start Date End Date Taking? Authorizing Provider  clindamycin (CLEOCIN) 150 MG capsule  Take 3 capsules (450 mg total) by mouth 3 (three) times daily for 10 days. 04/07/22 04/17/22 Yes Max Sane, MD  HYDROcodone-acetaminophen (NORCO) 5-325 MG tablet Take 1 tablet by mouth every 6 (six) hours as needed for moderate pain or severe pain. 04/02/22  Yes Naaman Plummer, MD  cyclobenzaprine (FLEXERIL) 10 MG tablet Take 1 tablet (10 mg total) by mouth 3 (three) times daily as needed. Patient not taking: Reported on 04/07/2022  01/15/18   Sable Feil, PA-C  ibuprofen (ADVIL,MOTRIN) 600 MG tablet Take 1 tablet (600 mg total) by mouth every 8 (eight) hours as needed. Patient not taking: Reported on 04/09/2022 01/15/18   Sable Feil, PA-C  ondansetron (ZOFRAN ODT) 4 MG disintegrating tablet Allow 1-2 tablets to dissolve in your mouth every 8 hours as needed for nausea/vomiting Patient not taking: Reported on 04/07/2022 02/24/16   Hinda Kehr, MD  ondansetron (ZOFRAN ODT) 4 MG disintegrating tablet Allow 1-2 tablets to dissolve in your mouth every 8 hours as needed for nausea/vomiting Patient not taking: Reported on 04/07/2022 02/24/16   Hinda Kehr, MD  ondansetron (ZOFRAN-ODT) 4 MG disintegrating tablet Take 1 tablet (4 mg total) by mouth every 8 (eight) hours as needed. Patient not taking: Reported on 04/07/2022 08/23/21   Vladimir Crofts, MD  sulfamethoxazole-trimethoprim (BACTRIM DS,SEPTRA DS) 800-160 MG tablet Take 1 tablet by mouth 2 (two) times daily. Patient not taking: Reported on 04/07/2022 01/15/18   Sable Feil, PA-C  tamsulosin [facility]) 0.4 MG CAPS capsule Take 1 tablet by mouth daily until you pass the kidney stone or no longer have symptoms Patient not taking: Reported on 04/07/2022 02/24/16   Hinda Kehr, MD    Physical Exam: Vitals:   04/09/22 1345 04/09/22 1400 04/09/22 1414 04/09/22 1618  BP: 114/69 110/62 109/77 (!) 101/54  Pulse: 69 67 71 61  Resp: 15 11 15 18   Temp:  98.4 F (36.9 C) 97.9 F (36.6 C) 98.1 F (36.7 C)  TempSrc:   Oral Oral  SpO2: 97% 96% 95% 94%  Weight:      Height:       General: Not in acute distress HEENT:       Eyes: PERRL, EOMI, no scleral icterus.       ENT: No discharge from the ears and nose, no pharynx injection, no tonsillar enlargement.        Neck: No JVD, no bruit, no mass felt. Heme: No neck lymph node enlargement. Cardiac: S1/S2, RRR, No murmurs, No gallops or rubs. Respiratory: No rales, wheezing, rhonchi or rubs. GI: Soft, nondistended, nontender,  no rebound pain, no organomegaly, BS present. GU: No hematuria Ext: No pitting leg edema bilaterally. 1+DP/PT pulse bilaterally. Musculoskeletal: No joint deformities, No joint redness or warmth, no limitation of ROM in spin. Skin: No rashes.  Neuro: Alert, oriented X3, cranial nerves II-XII grossly intact, moves all extremities normally. Muscle strength 5/5 in all extremities, sensation to light touch intact. Brachial reflex 2+ bilaterally. Knee reflex 1+ bilaterally. Negative Babinski's sign. Normal finger to nose test. Psych: Patient is not psychotic, no suicidal or hemocidal ideation.  Labs on Admission: I have personally reviewed following labs and imaging studies  CBC: Recent Labs  Lab 04/02/22 1807 04/06/22 1840 04/07/22 0500 04/09/22 0619  WBC 11.9* 14.1* 11.1* 18.0*  NEUTROABS 7.9* 10.0*  --  13.9*  HGB 15.3 15.8 15.0 14.1  HCT 46.3 47.0 45.5 42.3  MCV 94.3 91.3 92.3 92.6  PLT 284 298 307 [id number]   Basic Metabolic  Panel: Recent Labs  Lab 04/02/22 1807 04/06/22 1840 04/07/22 0500 04/09/22 0619 04/09/22 0847  NA 136 131* 136 133*  --   K 4.6 3.9 4.4 3.4*  --   CL 100 98 102 100  --   CO2 28 22 26 26   --   GLUCOSE 101* 108* 173* 130*  --   BUN 14 14 13 15   --   CREATININE 0.90 0.80 0.83 0.90  --   CALCIUM 9.1 9.2 8.9 8.7*  --   MG  --   --   --   --  2.0   GFR: Estimated Creatinine Clearance: 119.9 mL/min (by C-G formula based on SCr of 0.9 mg/dL). Liver Function Tests: Recent Labs  Lab 04/02/22 1807 04/06/22 1840  AST 16 15  ALT 16 13  ALKPHOS 60 68  BILITOT 0.8 1.0  PROT 7.9 8.4*  ALBUMIN 4.3 4.2   No results for input(s): "LIPASE", "AMYLASE" in the last 168 hours. No results for input(s): "AMMONIA" in the last 168 hours. Coagulation Profile: Recent Labs  Lab 04/09/22 0847  INR 1.1   Cardiac Enzymes: No results for input(s): "CKTOTAL", "CKMB", "CKMBINDEX", "TROPONINI" in the last 168 hours. BNP (last 3 results) No results for input(s): "PROBNP"  in the last 8760 hours. HbA1C: No results for input(s): "HGBA1C" in the last 72 hours. CBG: No results for input(s): "GLUCAP" in the last 168 hours. Lipid Profile: No results for input(s): "CHOL", "HDL", "LDLCALC", "TRIG", "CHOLHDL", "LDLDIRECT" in the last 72 hours. Thyroid Function Tests: No results for input(s): "TSH", "T4TOTAL", "FREET4", "T3FREE", "THYROIDAB" in the last 72 hours. Anemia Panel: No results for input(s): "VITAMINB12", "FOLATE", "FERRITIN", "TIBC", "IRON", "RETICCTPCT" in the last 72 hours. Urine analysis:    Component Value Date/Time   COLORURINE YELLOW (A) 01/15/2018 0847   APPEARANCEUR CLEAR (A) 01/15/2018 0847   LABSPEC 1.013 01/15/2018 0847   PHURINE 5.0 01/15/2018 0847   GLUCOSEU NEGATIVE 01/15/2018 0847   HGBUR LARGE (A) 01/15/2018 0847   BILIRUBINUR NEGATIVE 01/15/2018 0847   KETONESUR NEGATIVE 01/15/2018 0847   PROTEINUR NEGATIVE 01/15/2018 0847   NITRITE NEGATIVE 01/15/2018 0847   LEUKOCYTESUR NEGATIVE 01/15/2018 0847   Sepsis Labs: @LABRCNTIP (procalcitonin:4,lacticidven:4) )No results found for this or any previous visit (from the past 240 hour(s)).   Radiological Exams on Admission: CT Soft Tissue Neck W Contrast  Result Date: 04/09/2022 CLINICAL DATA:  42 year old male with increasing left neck and face abscess status post consultation by ENT and IV antibiotics but left AMA. Returns with increased swelling. EXAM: CT NECK WITH CONTRAST TECHNIQUE: Multidetector CT imaging of the neck was performed using the standard protocol following the bolus administration of intravenous contrast. RADIATION DOSE REDUCTION: This exam was performed according to the departmental dose-optimization program which includes automated exposure control, adjustment of the mA and/or kV according to patient size and/or use of iterative reconstruction technique. CONTRAST:  54mL OMNIPAQUE IOHEXOL 300 MG/ML  SOLN COMPARISON:  Neck CT 04/06/2022. FINDINGS: Pharynx and larynx:  Progressive inflammation throughout the left parapharyngeal space now. Associated swelling along the left lateral pharynx. Other pharyngeal soft tissue contours remain normal. No pharyngeal mucosal space hyperenhancement. Right parapharyngeal space, retropharyngeal space, and larynx remain within normal limits. Salivary glands: Bulky left submandibular and posterior sublingual space abscess along the undersurface of the posterior left mandible body (series 3, image 48) now encompasses about 33 x 25 by 46 mm (AP by transverse by CC) and appears mildly larger since 04/06/2022 (sagittal image 118 today). Estimated pus volume 19 mL.  Multi spatial surrounding soft tissue swelling and stranding has increased, with new secondary inflammation of the left parotid space and increased left masticator space involvement. Mass effect and secondary inflammation affecting the left submandibular gland appears increased. There is mild rightward mass effect on the central sublingual space which otherwise appears to remain normal on series 3, image 48. Contralateral right submandibular and parotid glands remain normal. Thyroid: Inflammation tracks anterior to the thyroid in the midline lower neck now. Otherwise negative. Lymph nodes: Reactive left level 1 lymph nodes persist up to 9 mm short axis. Smaller reactive left level 2 and level 3 nodes. No lymph node suppuration identified. Vascular: Major vascular structures in the neck and at the skull base remain patent including the left IJ. Limited intracranial: Negative. Visualized orbits: Minimally included. Mastoids and visualized paranasal sinuses: Visualized paranasal sinuses and mastoids are stable and well aerated. Skeleton: Redemonstrated cortical breakthrough from carious anterior residual left mandible molar associated periapical lucency, contiguous with the subperiosteal abscess on sagittal image 115. No other mandible cortical erosion. Adjacent wisdom tooth is present. No  other No acute osseous abnormality identified. Upper chest: No superior mediastinal inflammation or lymphadenopathy. Mild respiratory motion, visible upper lungs remain clear. IMPRESSION: 1. Substantial progression of left face and neck Cellulitis surrounding unresolved and mildly larger Left submandibular odontogenic Abscess (estimated 19 mL). Carious left mandible molar there with periapical dehiscent cortex. 2. Increased secondary inflammation throughout the soft tissue spaces of the left face and neck, relatively sparing the central sublingual space. Increased left submandibular gland mass effect and inflammation. Reactive regional lymph nodes. Electronically Signed   By: Genevie Ann M.D.   On: 04/09/2022 07:44      Assessment/Plan Principal Problem:   Dental abscess Active Problems:   Facial cellulitis   Sepsis ([place])   Bipolar 1 disorder ([facility])   Hypokalemia   Obesity (BMI 30-39.9)   Assessment and Plan:  Dental abscess, facial cellulitis and sepsis: consulted Dr. Pryor Ochoa of ENT --> s/p of trans-oral incision and drainage of dental/submandibular abscess. Pt meets criteria for sepsis with WBC 18.0, heart rate 95, RR 22.  Lactic acid is normal.  - Admitted to MedSurg bed as inpatient - Empiric antimicrobial treatment with unasyn - IV Decadron 10 mg 3 times daily - PRN Zofran for nausea, dilaudid and Percocet for pain - Blood cultures x 2  - ESR and CRP - will get Procalcitonin --> < 0.10 - IVF: 2. 0 L of LR bolus in ED, followed by 100 cc/h  Bipolar 1 disorder ([place]): pt is not taking meds now -observe closely  Hypokalemia: Potassium 3.4 -Repleted potassium -Check magnesium level --> 2.0  Obesity (BMI 30-39.9): Body weight by 101.2 kg, BMI 34.93 -Exercise and healthy diet -Encourage losing weight      DVT ppx: SCD  Code Status: Full code  Family Communication: not done, no family member is at bed side.     Disposition Plan:  Anticipate discharge back to previous  environment  Consults called:  Dr. Pryor Ochoa of ENT is consulted.  Admission status and Level of care: Med-Surg:    as inpt       Dispo: The patient is from: Home              Anticipated d/c is to: Home              Anticipated d/c date is: 2 days              Patient currently is not  medically stable to d/c.    Severity of Illness:  The appropriate patient status for this patient is INPATIENT. Inpatient status is judged to be reasonable and necessary in order to provide the required intensity of service to ensure the patient's safety. The patient's presenting symptoms, physical exam findings, and initial radiographic and laboratory data in the context of their chronic comorbidities is felt to place them at high risk for further clinical deterioration. Furthermore, it is not anticipated that the patient will be medically stable for discharge from the hospital within 2 midnights of admission.   * I certify that at the point of admission it is my clinical judgment that the patient will require inpatient hospital care spanning beyond 2 midnights from the point of admission due to high intensity of service, high risk for further deterioration and high frequency of surveillance required.*       Date of Service 04/09/2022    Ivor Costa [facility]   If 7PM-7AM, please contact night-coverage [email or web address] 04/09/2022, 5:04 PM

## 2022-04-09 NOTE — Progress Notes (Signed)
Pt refused SCD.

## 2022-04-09 NOTE — ED Provider Notes (Signed)
[facility] Provider Note    Event Date/Time   First MD Initiated Contact with Patient 04/09/22 [phone]     (approximate)   History   Abscess   HPI  David Burch is a 42 y.o. male who presents to the ED for evaluation of Abscess    I reviewed admission information from 3/17 and when he left [facility] 3/18.  Admitted for a periapical dental abscess but left AMA 2 days ago.  He returns to the ED with progressively worsening pain, swelling and difficulty breathing.  Reports he is able to swallow, with some odynophagia.  Reports the sensation of his throat being tighter and some difficulty breathing.  Chills, subjective fevers and increasing swelling/pain.  Physical Exam   Triage Vital Signs: ED Triage Vitals  Enc Vitals Group     BP 04/09/22 0603 128/75     Pulse Rate 04/09/22 0603 95     Resp 04/09/22 0603 (!) 22     Temp 04/09/22 0603 98.8 F (37.1 C)     Temp Source 04/09/22 0603 Oral     SpO2 04/09/22 0603 91 %     Weight 04/09/22 0553 223 lb (101.2 kg)     Height 04/09/22 0553 5\' 7"  (1.702 m)     Head Circumference --      Peak Flow --      Pain Score 04/09/22 0553 10     Pain Loc --      Pain Edu? --      Excl. in [place]? --     Most recent vital signs: Vitals:   04/09/22 0603  BP: 128/75  Pulse: 95  Resp: (!) 22  Temp: 98.8 F (37.1 C)  SpO2: 91%    General: Awake, no distress.  Laying back semirecumbent without distress.  Swallowing his own secretions. CV:  Good peripheral perfusion.  Resp:  Normal effort.  Abd:  No distention.  MSK:  No deformity noted.  Neuro:  No focal deficits appreciated. Other:  Only able to open his mouth about 1 cm.  Obvious left-sided facial swelling as noted below.  Firmness and induration to the anterior neck and floor of the mouth.     ED Results / Procedures / Treatments   Labs (all labs ordered are listed, but only abnormal results are displayed) Labs Reviewed  CBC WITH DIFFERENTIAL/PLATELET -  Abnormal; Notable for the following components:      Result Value   WBC 18.0 (*)    Neutro Abs 13.9 (*)    Monocytes Absolute 2.0 (*)    Abs Immature Granulocytes 0.13 (*)    All other components within normal limits  BASIC METABOLIC PANEL - Abnormal; Notable for the following components:   Sodium 133 (*)    Potassium 3.4 (*)    Glucose, Bld 130 (*)    Calcium 8.7 (*)    All other components within normal limits  LACTIC ACID, PLASMA  PROCALCITONIN  LACTIC ACID, PLASMA    EKG   RADIOLOGY   Official radiology report(s): No results found.  PROCEDURES and INTERVENTIONS:  .Critical Care  Performed by: Vladimir Crofts, MD Authorized by: Vladimir Crofts, MD   Critical care provider statement:    Critical care time (minutes):  30   Critical care time was exclusive of:  Separately billable procedures and treating other patients   Critical care was necessary to treat or prevent imminent or life-threatening deterioration of the following conditions:  Sepsis   Critical care  was time spent personally by me on the following activities:  Development of treatment plan with patient or surrogate, discussions with consultants, evaluation of patient's response to treatment, examination of patient, ordering and review of laboratory studies, ordering and review of radiographic studies, ordering and performing treatments and interventions, pulse oximetry, re-evaluation of patient's condition and review of old charts   Medications  ketorolac (TORADOL) 30 MG/ML injection 15 mg (15 mg Intravenous Given 04/09/22 [id number])  dexamethasone (DECADRON) injection 10 mg (10 mg Intravenous Given 04/09/22 [id number])  Ampicillin-Sulbactam (UNASYN) 3 g in sodium chloride 0.9 % 100 mL IVPB (3 g Intravenous New Bag/Given 04/09/22 [id number])  lactated ringers bolus 1,000 mL (1,000 mLs Intravenous New Bag/Given 04/09/22 0627)  iohexol (OMNIPAQUE) 300 MG/ML solution 75 mL (75 mLs Intravenous Contrast Given 04/09/22 0709)      IMPRESSION / MDM / [place] / ED COURSE  I reviewed the triage vital signs and the nursing notes.  Differential diagnosis includes, but is not limited to, periapical abscess, clear obstruction, [name], sepsis  {Patient presents with symptoms of an acute illness or injury that is potentially life-threatening.  42 year old male returns 2 days after leaving AMA with signs of sepsis from a dental abscess and possible [name].  He is not in any distress on arrival, but his presentation and clinical picture certainly makes me uncomfortable.  He is unable to open his mouth very widely.  He is currently swallowing his own secretions and satting about 93% on room air with a sensation of mild dyspnea with exertion or conversation, but none at rest.  I consult with ENT who is on the way to evaluate the patient.  They request repeat cross-sectional imaging.  Blood work returning with increasing leukocytosis, further suggestive of sepsis.  Provide Decadron, Toradol and antibiotics.  Signed out pending ENT evaluation and CT imaging with the expectation for readmission  Clinical Course as of 04/09/22 0717  Wed Apr 09, 2022  0639 I speak with ENT, Dr. Tami Ribas, he will come in an take a look [DS]  [facility] - ENT consulted. Given abx, decadron, toradol. Dental abscess concerning for ludwigs.  [SM]    Clinical Course User Index [DS] Vladimir Crofts, MD [SM] [name], MD     FINAL CLINICAL IMPRESSION(S) / ED DIAGNOSES   Final diagnoses:  Dental abscess  Angina, [name]     Rx / DC Orders   ED Discharge Orders     None        Note:  This document was prepared using Dragon voice recognition software and may include unintentional dictation errors.   Vladimir Crofts, MD 04/09/22 [phone]

## 2022-04-09 NOTE — ED Notes (Signed)
Last drink 0530 on 04/09/22 Last meal 1200 on 04/08/22

## 2022-04-09 NOTE — Consult Note (Signed)
David Burch, David Burch [id number] 03/04/1980 David Man, MD  Reason for Consult: Left neck abscess  HPI: 42 year old gentleman otherwise healthy was admitted on 318 for left periapical tooth abscess.  At that point it has spontaneously drained he was scheduled for dental extraction tomorrow.  He was advised that he stay 2 days in the hospital with IV antibiotics however he left AGAINST MEDICAL ADVICE the following day.  He returns to the emergency room this morning with increased pain and facial swelling.  Allergies: No Known Allergies  ROS: Review of systems normal other than 12 systems except per HPI.  PMH:  Past Medical History:  Diagnosis Date   Bipolar 1 disorder ([place])    PTSD (post-traumatic stress disorder)     FH: History reviewed. No pertinent family history.  SH:  Social History   Socioeconomic History   Marital status: Single    Spouse name: Not on file   Number of children: Not on file   Years of education: Not on file   Highest education level: Not on file  Occupational History   Not on file  Tobacco Use   Smoking status: Every Day    Packs/day: .5    Types: Cigarettes   Smokeless tobacco: Former  Scientific laboratory technician Use: Former  Substance and Sexual Activity   Alcohol use: Not Currently   Drug use: Not Currently   Sexual activity: Yes  Other Topics Concern   Not on file  Social History Narrative   Not on file   Social Determinants of Health   Financial Resource Strain: Not on file  Food Insecurity: No Food Insecurity (04/07/2022)   Hunger Vital Sign    Worried About Running Out of Food in the Last Year: Never true    Ran Out of Food in the Last Year: Never true  Transportation Needs: No Transportation Needs (04/07/2022)   PRAPARE - Hydrologist (Medical): No    Lack of Transportation (Non-Medical): No  Physical Activity: Not on file  Stress: Not on file  Social Connections: Not on file  Intimate Partner Violence: Not At  Risk (04/07/2022)   Humiliation, Afraid, Rape, and Kick questionnaire    Fear of Current or Ex-Partner: No    Emotionally Abused: No    Physically Abused: No    Sexually Abused: No    PSH:  Past Surgical History:  Procedure Laterality Date   ORTHOPEDIC SURGERY      Physical  Exam: Patient upright awake in the bed, no stridor no apparent airway compromise.  Obvious left-sided facial swelling.  External ears appear normal with anterior nose benign oral cavity oropharynx moderate trismus, mild floor mouth anterior swelling, 2 left posterior broken molar teeth with pus emanating around the teeth, swelling of the left retromolar trigone region.  Obvious firm swelling of the left face extending into the left upper neck.  Review of CT scan compared with 3/18 there continues to be abscess around the left posterior molars of the mandible extending behind the angle of the mandible.  There is also significant soft tissue swelling as is visible on physical exam.  Procedure: Flexible fiberoptic laryngoscopy-at the bedside after patient's consent a topical anesthetic of phenylephrine lidocaine solution was dripped within each nostril.  Approximately 5 minutes a flexible fiberoptic laryngoscope was introduced into the nostril on the left-hand side.  Examination of the larynx showed the tongue base epiglottis and larynx were within normal limits.  There was no significant tongue base  epiglottis or laryngeal edema.  Heart regular rate and rhythm, lungs mild wheezing   A/P: Left periapical tooth abscess with significant facial cellulitis and swelling.  Patient has had nothing to eat or drink except water about 3 hours ago.  Long discussion with the patient he will need to be readmitted for IV antibiotics and steroids as well as incision and drainage of the left periapical tooth/neck abscess.  He does have some mild to moderate trismus, however I think at oral intubation is possible once he is relaxed.  This should  release significant trismus.  We also discussed avenues of drainage either transorally or through the external neck.  He understands the risk and benefits including bleeding infection airway compromise and possible death.  He is eager to proceed.  Dr. Pryor Ochoa is covering ENT surgeon for [place] today, I have spoken to the operating room and we will try to proceed with the surgery around noon.  Patient agrees and is eager to proceed.  Would recommend Unasyn 3 g IV every 6 and Decadron 10 mg IV every 8.  Hospitalist service will admit him for IV medication.   Roena Malady 04/09/2022 7:54 AM

## 2022-04-09 NOTE — Anesthesia Procedure Notes (Signed)
Procedure Name: Intubation Date/Time: 04/09/2022 12:38 PM  Performed by: Lenord Fellers, RNPre-anesthesia Checklist: Patient identified, Emergency Drugs available, Suction available and Patient being monitored Patient Re-evaluated:Patient Re-evaluated prior to induction Oxygen Delivery Method: Circle system utilized Preoxygenation: Pre-oxygenation with 100% oxygen Induction Type: IV induction Ventilation: Two handed mask ventilation required Laryngoscope Size: McGraph and 4 Grade View: Grade I Tube type: Oral Rae Number of attempts: 1 Airway Equipment and Method: Stylet Placement Confirmation: ETT inserted through vocal cords under direct vision, positive ETCO2 and breath sounds checked- equal and bilateral Tube secured with: Tape Dental Injury: Teeth and Oropharynx as per pre-operative assessment

## 2022-04-09 NOTE — Consult Note (Signed)
Pharmacy Antibiotic Note  David Burch is a 42 y.o. male admitted on 04/09/2022 with left periapical tooth abscess with significant facial cellulitis and swelling. Patient originally presented 3/17 but left [facility] 3/18.  Pharmacy has been consulted for Unasyn dosing.  Plan: Initiaite Unasyn 3 gram Q6H F/u I&D cultures   Height: 5\' 7"  (170.2 cm) Weight: 101.2 kg (223 lb) IBW/kg (Calculated) : 66.1  Temp (24hrs), Avg:98.8 F (37.1 C), Min:98.8 F (37.1 C), Max:98.8 F (37.1 C)  Recent Labs  Lab 04/02/22 1807 04/06/22 1840 04/07/22 0500 04/09/22 0619 04/09/22 0800  WBC 11.9* 14.1* 11.1* 18.0*  --   CREATININE 0.90 0.80 0.83 0.90  --   LATICACIDVEN 0.7 0.9  --  0.8 0.7    Estimated Creatinine Clearance: 119.9 mL/min (by C-G formula based on SCr of 0.9 mg/dL).    Allergies  Allergen Reactions   Tramadol     Muscle Spasms    Antimicrobials this admission: 3/20 Unasyn >>    Dose adjustments this admission:   Microbiology results: 3/20 BCx: pending  Thank you for allowing pharmacy to be a part of this patient's care.  Dorothe Pea, PharmD, BCPS Clinical Pharmacist   04/09/2022 8:49 AM

## 2022-04-09 NOTE — ED Provider Notes (Signed)
Care assumed of patient from outgoing provider.  See their note for initial history, exam and plan.  [facility] consulted. Given abx, decadron, toradol. Dental abscess concerning for ludwigs.  [SM]     [name], MD 04/09/22 [id number]

## 2022-04-10 ENCOUNTER — Encounter: Payer: Self-pay | Admitting: Otolaryngology

## 2022-04-10 DIAGNOSIS — A419 Sepsis, unspecified organism: Secondary | ICD-10-CM | POA: Diagnosis not present

## 2022-04-10 DIAGNOSIS — E669 Obesity, unspecified: Secondary | ICD-10-CM

## 2022-04-10 DIAGNOSIS — K047 Periapical abscess without sinus: Secondary | ICD-10-CM | POA: Diagnosis not present

## 2022-04-10 LAB — BASIC METABOLIC PANEL
Anion gap: 7 (ref 5–15)
BUN: 13 mg/dL (ref 6–20)
CO2: 25 mmol/L (ref 22–32)
Calcium: 8.2 mg/dL — ABNORMAL LOW (ref 8.9–10.3)
Chloride: 102 mmol/L (ref 98–111)
Creatinine, Ser: 0.64 mg/dL (ref 0.61–1.24)
GFR, Estimated: >60 mL/min (ref >60)
Glucose, Bld: 188 mg/dL — ABNORMAL HIGH (ref 70–99)
Potassium: 4.5 mmol/L (ref 3.5–5.1)
Sodium: 134 mmol/L — ABNORMAL LOW (ref 135–145)

## 2022-04-10 LAB — CBC
HCT: 35.9 % — ABNORMAL LOW (ref 39.0–52.0)
Hemoglobin: 12 g/dL — ABNORMAL LOW (ref 13.0–17.0)
MCH: 30.9 pg (ref 26.0–34.0)
MCHC: 33.4 g/dL (ref 30.0–36.0)
MCV: 92.5 fL (ref 80.0–100.0)
Platelets: 271 10*3/uL (ref 150–400)
RBC: 3.88 MIL/uL — ABNORMAL LOW (ref 4.22–5.81)
RDW: 12.6 % (ref 11.5–15.5)
WBC: 16.9 10*3/uL — ABNORMAL HIGH (ref 4.0–10.5)
nRBC: 0 % (ref 0.0–0.2)

## 2022-04-10 LAB — GLUCOSE, CAPILLARY: Glucose-Capillary: 146 mg/dL — ABNORMAL HIGH (ref 70–99)

## 2022-04-10 LAB — C-REACTIVE PROTEIN: CRP: 17.1 mg/dL — ABNORMAL HIGH (ref <1.0)

## 2022-04-10 MED ORDER — FAMOTIDINE 20 MG PO TABS
20.0000 mg | ORAL_TABLET | Freq: Two times a day (BID) | ORAL | Status: DC
  Administered 2022-04-10 – 2022-04-11 (×3): 20 mg via ORAL
  Filled 2022-04-10 (×3): qty 1

## 2022-04-10 MED ORDER — ENOXAPARIN SODIUM 40 MG/0.4ML IJ SOSY
40.0000 mg | PREFILLED_SYRINGE | INTRAMUSCULAR | Status: DC
  Filled 2022-04-10: qty 0.4

## 2022-04-10 NOTE — Anesthesia Postprocedure Evaluation (Signed)
Anesthesia Post Note  Patient: David Burch  Procedure(s) Performed: INCISION AND DRAINAGE NECK ABSCESS (Neck)  Patient location during evaluation: PACU Anesthesia Type: General Level of consciousness: awake and alert Pain management: pain level controlled Vital Signs Assessment: post-procedure vital signs reviewed and stable Respiratory status: spontaneous breathing, nonlabored ventilation, respiratory function stable and patient connected to nasal cannula oxygen Cardiovascular status: blood pressure returned to baseline and stable Postop Assessment: no apparent nausea or vomiting Anesthetic complications: no   No notable events documented.   Last Vitals:  Vitals:   04/09/22 1923 04/10/22 0402  BP:  120/77  Pulse:  71  Resp:  18  Temp:  36.6 C  SpO2: 95% 95%    Last Pain:  Vitals:   04/10/22 0448  TempSrc:   PainSc: [place]

## 2022-04-10 NOTE — Progress Notes (Signed)
PROGRESS NOTE    David Burch  [id number] DOB: July 12, 1980 DOA: 04/09/2022 PCP: Pcp, No   Assessment & Plan:   Principal Problem:   Dental abscess Active Problems:   Facial cellulitis   Sepsis ([place])   Bipolar 1 disorder ([place])   Hypokalemia   Obesity (BMI 30-39.9)  Assessment and Plan: No notes have been filed under this hospital service. Service: Hospitalist   Dental abscess: w/ facial cellulitis. S/p trans-oral incision & drainage of dental/submandibular abscess. Continue on IV unasyn, steroids. Blood cxs are pending. Percocet prn for pain. Still w/ significant facial swelling    Sepsis: met criteria w/ tachycardia, tachypnea, leukocytosis & dental abscess. Continue on IV abxs   Bipolar 1 disorder: pt is not taking meds currently    Hypokalemia: WNL today    Obesity: BMI 34.9. Would benefit from weight loss     DVT prophylaxis: lovenox  Code Status: full  Family Communication: Disposition Plan: likely d/c back home  Level of care: Med-Surg Status is: Inpatient Remains inpatient appropriate because: severity of illness    Consultants:  ENT  Procedures:   Antimicrobials: unasyn  Subjective: Pt c/o facial & tooth pain   Objective: Vitals:   04/09/22 1618 04/09/22 1923 04/10/22 0402 04/10/22 0813  BP: (!) 101/54  120/77 (!) 143/75  Pulse: 61  71 68  Resp: 18  18 18   Temp: 98.1 F (36.7 C)  97.9 F (36.6 C) 98 F (36.7 C)  TempSrc: Oral  Oral   SpO2: 94% 95% 95% 98%  Weight:      Height:        Intake/Output Summary (Last 24 hours) at 04/10/2022 1407 Last data filed at 04/10/2022 1407 Gross per 24 hour  Intake 2781.72 ml  Output 2220 ml  Net 561.72 ml   Filed Weights   04/09/22 0553 04/09/22 1122  Weight: 101.2 kg 101.2 kg    Examination:  General exam: Appears calm but uncomfortable  Respiratory system: Clear to auscultation. Respiratory effort normal. Cardiovascular system: S1 & S2+. No rubs, gallops or clicks. Gastrointestinal  system: Abdomen is obese, soft and nontender. Normal bowel sounds heard. Central nervous system: Alert and oriented. Moves all extremities  Psychiatry: Judgement and insight appear normal. Mood & affect appropriate.     Data Reviewed: I have personally reviewed following labs and imaging studies  CBC: Recent Labs  Lab 04/06/22 1840 04/07/22 0500 04/09/22 0619 04/10/22 0455  WBC 14.1* 11.1* 18.0* 16.9*  NEUTROABS 10.0*  --  13.9*  --   HGB 15.8 15.0 14.1 12.0*  HCT 47.0 45.5 42.3 35.9*  MCV 91.3 92.3 92.6 92.5  PLT 298 307 295 [id number]   Basic Metabolic Panel: Recent Labs  Lab 04/06/22 1840 04/07/22 0500 04/09/22 0619 04/09/22 0847 04/10/22 0455  NA 131* 136 133*  --  134*  K 3.9 4.4 3.4*  --  4.5  CL 98 102 100  --  102  CO2 22 26 26   --  25  GLUCOSE 108* 173* 130*  --  188*  BUN 14 13 15   --  13  CREATININE 0.80 0.83 0.90  --  0.64  CALCIUM 9.2 8.9 8.7*  --  8.2*  MG  --   --   --  2.0  --    GFR: Estimated Creatinine Clearance: 134.9 mL/min (by C-G formula based on SCr of 0.64 mg/dL). Liver Function Tests: Recent Labs  Lab 04/06/22 1840  AST 15  ALT 13  ALKPHOS 68  BILITOT 1.0  PROT 8.4*  ALBUMIN 4.2   No results for input(s): "LIPASE", "AMYLASE" in the last 168 hours. No results for input(s): "AMMONIA" in the last 168 hours. Coagulation Profile: Recent Labs  Lab 04/09/22 0847  INR 1.1   Cardiac Enzymes: No results for input(s): "CKTOTAL", "CKMB", "CKMBINDEX", "TROPONINI" in the last 168 hours. BNP (last 3 results) No results for input(s): "PROBNP" in the last 8760 hours. HbA1C: No results for input(s): "HGBA1C" in the last 72 hours. CBG: Recent Labs  Lab 04/10/22 0813  GLUCAP 146*   Lipid Profile: No results for input(s): "CHOL", "HDL", "LDLCALC", "TRIG", "CHOLHDL", "LDLDIRECT" in the last 72 hours. Thyroid Function Tests: No results for input(s): "TSH", "T4TOTAL", "FREET4", "T3FREE", "THYROIDAB" in the last 72 hours. Anemia Panel: No  results for input(s): "VITAMINB12", "FOLATE", "FERRITIN", "TIBC", "IRON", "RETICCTPCT" in the last 72 hours. Sepsis Labs: Recent Labs  Lab 04/06/22 1840 04/09/22 0619 04/09/22 0800  PROCALCITON  --  <0.10  --   LATICACIDVEN 0.9 0.8 0.7    Recent Results (from the past 240 hour(s))  Culture, blood (Routine X 2) w Reflex to ID Panel     Status: None (Preliminary result)   Collection Time: 04/09/22  8:47 AM   Specimen: BLOOD  Result Value Ref Range Status   Specimen Description BLOOD  RIGHT [facility]  Final   Special Requests   Final    BOTTLES DRAWN AEROBIC AND ANAEROBIC Blood Culture results may not be optimal due to an excessive volume of blood received in culture bottles   Culture  Setup Time ANAEROBIC BOTTLE ONLY  Final   Culture   Final    NO GROWTH 1 DAY Performed at [facility], [address], [place], [place] [address]    Report Status PENDING  Incomplete  Culture, blood (Routine X 2) w Reflex to ID Panel     Status: None (Preliminary result)   Collection Time: 04/09/22  8:47 AM   Specimen: BLOOD  Result Value Ref Range Status   Specimen Description BLOOD LEFT AC  Final   Special Requests   Final    BOTTLES DRAWN AEROBIC AND ANAEROBIC Blood Culture results may not be optimal due to an excessive volume of blood received in culture bottles   Culture   Final    NO GROWTH 1 DAY Performed at [facility], [address] [place], [place] [address]    Report Status PENDING  Incomplete  Aerobic Culture w Gram Stain (superficial specimen)     Status: None (Preliminary result)   Collection Time: 04/09/22 12:53 PM   Specimen: Wound; Abscess  Result Value Ref Range Status   Specimen Description   Final    ABSCESS Performed at [facility], [address] [place], [place] [address]    Special Requests   Final    LEFT NECK Performed at [facility], [address] [place], [place] [address]    Gram Stain NO WBC SEEN RARE GRAM NEGATIVE  RODS   Final   Culture   Final    MODERATE HAEMOPHILUS PARAINFLUENZAE BETA LACTAMASE POSITIVE Performed at [facility], [place] [address] [place], [place] [address]    Report Status PENDING  Incomplete         Radiology Studies: CT Soft Tissue Neck W Contrast  Result Date: 04/09/2022 CLINICAL DATA:  42 year old male with increasing left neck and face abscess status post consultation by ENT and IV antibiotics but left AMA. Returns with increased swelling. EXAM: CT  NECK WITH CONTRAST TECHNIQUE: Multidetector CT imaging of the neck was performed using the standard protocol following the bolus administration of intravenous contrast. RADIATION DOSE REDUCTION: This exam was performed according to the departmental dose-optimization program which includes automated exposure control, adjustment of the mA and/or kV according to patient size and/or use of iterative reconstruction technique. CONTRAST:  8mL OMNIPAQUE IOHEXOL 300 MG/ML  SOLN COMPARISON:  Neck CT 04/06/2022. FINDINGS: Pharynx and larynx: Progressive inflammation throughout the left parapharyngeal space now. Associated swelling along the left lateral pharynx. Other pharyngeal soft tissue contours remain normal. No pharyngeal mucosal space hyperenhancement. Right parapharyngeal space, retropharyngeal space, and larynx remain within normal limits. Salivary glands: Bulky left submandibular and posterior sublingual space abscess along the undersurface of the posterior left mandible body (series 3, image 48) now encompasses about 33 x 25 by 46 mm (AP by transverse by CC) and appears mildly larger since 04/06/2022 (sagittal image 118 today). Estimated pus volume 19 mL. Multi spatial surrounding soft tissue swelling and stranding has increased, with new secondary inflammation of the left parotid space and increased left masticator space involvement. Mass effect and secondary inflammation affecting the left submandibular gland appears increased.  There is mild rightward mass effect on the central sublingual space which otherwise appears to remain normal on series 3, image 48. Contralateral right submandibular and parotid glands remain normal. Thyroid: Inflammation tracks anterior to the thyroid in the midline lower neck now. Otherwise negative. Lymph nodes: Reactive left level 1 lymph nodes persist up to 9 mm short axis. Smaller reactive left level 2 and level 3 nodes. No lymph node suppuration identified. Vascular: Major vascular structures in the neck and at the skull base remain patent including the left IJ. Limited intracranial: Negative. Visualized orbits: Minimally included. Mastoids and visualized paranasal sinuses: Visualized paranasal sinuses and mastoids are stable and well aerated. Skeleton: Redemonstrated cortical breakthrough from carious anterior residual left mandible molar associated periapical lucency, contiguous with the subperiosteal abscess on sagittal image 115. No other mandible cortical erosion. Adjacent wisdom tooth is present. No other No acute osseous abnormality identified. Upper chest: No superior mediastinal inflammation or lymphadenopathy. Mild respiratory motion, visible upper lungs remain clear. IMPRESSION: 1. Substantial progression of left face and neck Cellulitis surrounding unresolved and mildly larger Left submandibular odontogenic Abscess (estimated 19 mL). Carious left mandible molar there with periapical dehiscent cortex. 2. Increased secondary inflammation throughout the soft tissue spaces of the left face and neck, relatively sparing the central sublingual space. Increased left submandibular gland mass effect and inflammation. Reactive regional lymph nodes. Electronically Signed   By: Genevie Ann M.D.   On: 04/09/2022 07:44        Scheduled Meds:  albuterol  2.5 mg Inhalation BID   chlorhexidine  15 mL Mouth/Throat QID   dexamethasone (DECADRON) injection  10 mg Intravenous Q8H   enoxaparin (LOVENOX) injection   40 mg Subcutaneous Q24H   potassium chloride  40 mEq Oral Once   Continuous Infusions:  sodium chloride 100 mL/hr at 04/10/22 1055   ampicillin-sulbactam (UNASYN) IV 3 g (04/10/22 1243)     LOS: 1 day    Time spent: 35 mins     Wyvonnia Dusky, MD [facility] Pager [phone]  If 7PM-7AM, please contact night-coverage [email or web address] 04/10/2022, 2:07 PM

## 2022-04-11 DIAGNOSIS — E669 Obesity, unspecified: Secondary | ICD-10-CM | POA: Diagnosis not present

## 2022-04-11 DIAGNOSIS — F319 Bipolar disorder, unspecified: Secondary | ICD-10-CM

## 2022-04-11 DIAGNOSIS — K047 Periapical abscess without sinus: Secondary | ICD-10-CM | POA: Diagnosis not present

## 2022-04-11 LAB — AEROBIC CULTURE W GRAM STAIN (SUPERFICIAL SPECIMEN): Gram Stain: NONE SEEN

## 2022-04-11 LAB — CBC
HCT: 38.5 % — ABNORMAL LOW (ref 39.0–52.0)
Hemoglobin: 12.9 g/dL — ABNORMAL LOW (ref 13.0–17.0)
MCH: 30.7 pg (ref 26.0–34.0)
MCHC: 33.5 g/dL (ref 30.0–36.0)
MCV: 91.7 fL (ref 80.0–100.0)
Platelets: 325 10*3/uL (ref 150–400)
RBC: 4.2 MIL/uL — ABNORMAL LOW (ref 4.22–5.81)
RDW: 12.5 % (ref 11.5–15.5)
WBC: 21.1 10*3/uL — ABNORMAL HIGH (ref 4.0–10.5)
nRBC: 0 % (ref 0.0–0.2)

## 2022-04-11 LAB — BASIC METABOLIC PANEL
Anion gap: 13 (ref 5–15)
BUN: 17 mg/dL (ref 6–20)
CO2: 25 mmol/L (ref 22–32)
Calcium: 8.9 mg/dL (ref 8.9–10.3)
Chloride: 101 mmol/L (ref 98–111)
Creatinine, Ser: 0.69 mg/dL (ref 0.61–1.24)
GFR, Estimated: >60 mL/min (ref >60)
Glucose, Bld: 158 mg/dL — ABNORMAL HIGH (ref 70–99)
Potassium: 4.5 mmol/L (ref 3.5–5.1)
Sodium: 139 mmol/L (ref 135–145)

## 2022-04-11 LAB — GLUCOSE, CAPILLARY: Glucose-Capillary: 129 mg/dL — ABNORMAL HIGH (ref 70–99)

## 2022-04-11 LAB — CULTURE, BLOOD (ROUTINE X 2)

## 2022-04-11 MED ORDER — AMOXICILLIN-POT CLAVULANATE 875-125 MG PO TABS: 1.0000 | ORAL_TABLET | Freq: Two times a day (BID) | ORAL | 0 refills | Status: AC

## 2022-04-11 MED ORDER — PREDNISONE 20 MG PO TABS: 40.0000 mg | ORAL_TABLET | Freq: Every day | ORAL | 0 refills | Status: AC

## 2022-04-11 MED ORDER — OXYCODONE-ACETAMINOPHEN 5-325 MG PO TABS: 1.0000 | ORAL_TABLET | Freq: Four times a day (QID) | ORAL | 0 refills | Status: AC | PRN

## 2022-04-11 NOTE — Progress Notes (Signed)
..  04/11/2022 8:40 AM  [name] [id number]  Post-Op Day 1    Temp:  [97.3 F (36.3 C)-97.9 F (36.6 C)] 97.7 F (36.5 C) (03/22 0812) Pulse Rate:  [50-61] 51 (03/22 0812) Resp:  [18-20] 18 (03/22 0812) BP: (103-142)/(71-87) 142/85 (03/22 0812) SpO2:  [96 %-100 %] 100 % (03/22 0812),     Intake/Output Summary (Last 24 hours) at 04/11/2022 0840 Last data filed at 04/11/2022 0555 Gross per 24 hour  Intake 3278.27 ml  Output 710 ml  Net 2568.27 ml    Results for orders placed or performed during the hospital encounter of 04/09/22 (from the past 24 hour(s))  CBC     Status: Abnormal   Collection Time: 04/11/22  5:39 AM  Result Value Ref Range   WBC 21.1 (H) 4.0 - 10.5 K/uL   RBC 4.20 (L) 4.22 - 5.81 MIL/uL   Hemoglobin 12.9 (L) 13.0 - 17.0 g/dL   HCT 38.5 (L) 39.0 - 52.0 %   MCV 91.7 80.0 - 100.0 fL   MCH 30.7 26.0 - 34.0 pg   MCHC 33.5 30.0 - 36.0 g/dL   RDW 12.5 11.5 - 15.5 %   Platelets 325 150 - 400 K/uL   nRBC 0.0 0.0 - 0.2 %  Basic metabolic panel     Status: Abnormal   Collection Time: 04/11/22  5:39 AM  Result Value Ref Range   Sodium 139 135 - 145 mmol/L   Potassium 4.5 3.5 - 5.1 mmol/L   Chloride 101 98 - 111 mmol/L   CO2 25 22 - 32 mmol/L   Glucose, Bld 158 (H) 70 - 99 mg/dL   BUN 17 6 - 20 mg/dL   Creatinine, Ser 0.69 0.61 - 1.24 mg/dL   Calcium 8.9 8.9 - 10.3 mg/dL   GFR, Estimated >60 >60 mL/min   Anion gap 13 5 - 15  Glucose, capillary     Status: Abnormal   Collection Time: 04/11/22  8:14 AM  Result Value Ref Range   Glucose-Capillary 129 (H) 70 - 99 mg/dL    SUBJECTIVE:  No acute events overnight.  Reports improved pain and swelling. Some drainage last night.  OBJECTIVE:  GEN- NAD OC/OP- improved edema and erythema, no purulent drainage, floor of mouth WNL NECK-  improved induration but still present posterior to mandible and in submandibular region  IMPRESSION:  s/p incision and drainage of odontogenic abscess  PLAN:  continue IV abx  and steroids.  Convert to PO if continues to improve tomorrow.  Follow cultures.  Will re-eval tomorrow for possible discharge depending on improvement.  Shenae Bonanno 04/11/2022, 8:40 AM

## 2022-04-11 NOTE — Plan of Care (Signed)
  Problem: Education: Goal: Knowledge of General Education information will improve Description: Including pain rating scale, medication(s)/side effects and non-pharmacologic comfort measures Outcome: Progressing   Problem: Clinical Measurements: Goal: Will remain free from infection Outcome: Progressing   Problem: Clinical Measurements: Goal: Respiratory complications will improve Outcome: Progressing   Problem: Activity: Goal: Risk for activity intolerance will decrease Outcome: Progressing   Problem: Nutrition: Goal: Adequate nutrition will be maintained Outcome: Progressing   Problem: Coping: Goal: Level of anxiety will decrease Outcome: Progressing   Problem: Pain Managment: Goal: General experience of comfort will improve Outcome: Progressing

## 2022-04-11 NOTE — Final Progress Note (Signed)
..  04/11/2022 12:39 PM  [name] [id number]  Post-Op Day 2    Temp:  [97.3 F (36.3 C)-97.9 F (36.6 C)] 97.7 F (36.5 C) (03/22 0812) Pulse Rate:  [50-61] 51 (03/22 0812) Resp:  [18-20] 18 (03/22 0812) BP: (103-142)/(71-87) 142/85 (03/22 0812) SpO2:  [96 %-100 %] 100 % (03/22 0812),     Intake/Output Summary (Last 24 hours) at 04/11/2022 1239 Last data filed at 04/11/2022 1116 Gross per 24 hour  Intake 3916.05 ml  Output 1110 ml  Net 2806.05 ml    Results for orders placed or performed during the hospital encounter of 04/09/22 (from the past 24 hour(s))  CBC     Status: Abnormal   Collection Time: 04/11/22  5:39 AM  Result Value Ref Range   WBC 21.1 (H) 4.0 - 10.5 K/uL   RBC 4.20 (L) 4.22 - 5.81 MIL/uL   Hemoglobin 12.9 (L) 13.0 - 17.0 g/dL   HCT 38.5 (L) 39.0 - 52.0 %   MCV 91.7 80.0 - 100.0 fL   MCH 30.7 26.0 - 34.0 pg   MCHC 33.5 30.0 - 36.0 g/dL   RDW 12.5 11.5 - 15.5 %   Platelets 325 150 - 400 K/uL   nRBC 0.0 0.0 - 0.2 %  Basic metabolic panel     Status: Abnormal   Collection Time: 04/11/22  5:39 AM  Result Value Ref Range   Sodium 139 135 - 145 mmol/L   Potassium 4.5 3.5 - 5.1 mmol/L   Chloride 101 98 - 111 mmol/L   CO2 25 22 - 32 mmol/L   Glucose, Bld 158 (H) 70 - 99 mg/dL   BUN 17 6 - 20 mg/dL   Creatinine, Ser 0.69 0.61 - 1.24 mg/dL   Calcium 8.9 8.9 - 10.3 mg/dL   GFR, Estimated >60 >60 mL/min   Anion gap 13 5 - 15  Glucose, capillary     Status: Abnormal   Collection Time: 04/11/22  8:14 AM  Result Value Ref Range   Glucose-Capillary 129 (H) 70 - 99 mg/dL    SUBJECTIVE:  No acute events overnight. Reports continued improvement in pain and swelling.  Still has bad taste intermittently  OBJECTIVE:  GEN- NAD OC/OP-  continued improvement in edema and erythema of mucosa NECK- improved induration and tenderness in submandibular region and on posterior mandible.  No fluctuance  IMPRESSION:  s/p I&D of odontogenic abscess doing well  PLAN:   OK to discharge from ENT perspective.  Culture grew out H Flu and mixed oral flora.  Per records, patient was already on Clindamycin and Amoxicillin previously.  Could consider discharge home on Augmentin 875 PO BID along with Amoxicillin 500mg  TID to improve coverage along with Sterapred DS 6 day taper.  Recommend follow up with Dentist on Monday for tooth extraction.  Carloyn Manner 04/11/2022, 12:39 PM

## 2022-04-11 NOTE — Progress Notes (Signed)
Pt refused lovenox sub-cutaneous injection but was educated about its importance. Will continue to monitor.

## 2022-04-11 NOTE — Plan of Care (Signed)
PIV x2 removed.  Discharge instructions given. Patient belongings given and taken with patient. Patient d/c home.

## 2022-04-11 NOTE — Discharge Summary (Signed)
Physician Discharge Summary  David Burch [id number] DOB: [birth year] DOA: 04/09/2022  PCP: Merryl Hacker, No  Admit date: 04/09/2022 Discharge date: 04/11/2022  Admitted From: home  Disposition:  home   Recommendations for Outpatient Follow-up:  Follow up with PCP in 1-2 weeks F/u w/ a dentist ASAP  Home Health: no  Equipment/Devices:  Discharge Condition: stable  CODE STATUS: full  Diet recommendation: Heart Healthy   Brief/Interim Summary: HPI was taken from Dr. Blaine Hamper: David Burch is a 42 y.o. male with medical history significant of bipolar, PTSD, tobacco abuse, who presents with left facial pain and dental abscess.   Patient was admitted to hospital on 3/17 due to dental abscess.  Patient was treated with Unasyn and Decadron.  ENT was consulted, no procedure needed per Dr. Ayesha Mohair of ENT. Pt left hospital on [place] on 3/18. Pt was seen by oral surgery yesterday and scheduled for procedure on Thursday 3/21. Due to worsening left facial pain and swelling, patient comes back to the hospital.  The pain is constant, severe, sharp, nonradiating.  Patient does not have fever and chills.  Patient has mild dry cough, no chest pain or shortness breath. No nausea vomiting, diarrhea or abdominal pain.  No symptoms of UTI .    Data reviewed independently and ED Course: pt was found to have WBC 18.0, potassium 3.4, GFR> 60, temperature normal, blood pressure 128/75, heart rate 95, RR 22, oxygen saturation 88% initially, currently 95% on room air.  Patient is admitted to [facility] bed as inpatient.  Dr. Pryor Ochoa of ENT is consulted.     CT-neck soft tissue 1. Substantial progression of left face and neck Cellulitis surrounding unresolved and mildly larger Left submandibular odontogenic Abscess (estimated 19 mL). Carious left mandible molar there with periapical dehiscent cortex.   2. Increased secondary inflammation throughout the soft tissue spaces of the left face and neck, relatively sparing the  central sublingual space. Increased left submandibular gland mass effect and inflammation. Reactive regional lymph node   As per Dr. Jimmye Norman 3/20-3/22/24: Pt was found to have dental abscess w/ facial cellulitis and is s/p trans-oral I& of dental/submandibular abscess as per ENT. Cx grew haemophilus parainfluenzae but was not finalized at the time of d/c. Pt received IV unasyn & steroids while inpatient & was d/c home on augmentin & po steroids. Pt was educated on the importance of seeing a dentist outpatient as soon as possible. Pt verbalized his understanding   Discharge Diagnoses:  Principal Problem:   Dental abscess Active Problems:   Facial cellulitis   Sepsis ([place])   Bipolar 1 disorder (HCC)   Hypokalemia   Obesity (BMI 30-39.9)   Dental abscess: w/ facial cellulitis. S/p trans-oral incision & drainage of dental/submandibular abscess. Continue on IV unasyn, steroids while inpatient & was d/c home po augmentin & prednisone. Blood cxs NGTD. Percocet prn for pain. Much improved facial swelling     Sepsis: met criteria w/ tachycardia, tachypnea, leukocytosis & dental abscess. Continue on abxs. Sepsis resolved   Bipolar 1 disorder: pt is not taking meds currently    Hypokalemia: WNL today    Obesity: BMI 34.9. Would benefit from weight loss   Discharge Instructions  Discharge Instructions     Diet general   Complete by: As directed    Discharge instructions   Complete by: As directed    F/u w/ a dentist as soon as possible. F/u w/ PCP w/in 1-2 weeks   Increase activity slowly   Complete by: As directed  Allergies as of 04/11/2022       Reactions   Tramadol    Muscle Spasms        Medication List     STOP taking these medications    clindamycin 150 MG capsule Commonly known as: CLEOCIN   cyclobenzaprine 10 MG tablet Commonly known as: FLEXERIL   HYDROcodone-acetaminophen 5-325 MG tablet Commonly known as: Norco   ibuprofen 600 MG tablet Commonly  known as: ADVIL   ondansetron 4 MG disintegrating tablet Commonly known as: ZOFRAN-ODT   sulfamethoxazole-trimethoprim 800-160 MG tablet Commonly known as: BACTRIM DS       TAKE these medications    amoxicillin-clavulanate 875-125 MG tablet Commonly known as: AUGMENTIN Take 1 tablet by mouth 2 (two) times daily for 4 days.   oxyCODONE-acetaminophen 5-325 MG tablet Commonly known as: PERCOCET/ROXICET Take 1 tablet by mouth every 6 (six) hours as needed for up to 5 days for moderate pain or severe pain.   predniSONE 20 MG tablet Commonly known as: DELTASONE Take 2 tablets (40 mg total) by mouth daily for 4 days.   tamsulosin 0.4 MG Caps capsule Commonly known as: FLOMAX Take 1 tablet by mouth daily until you pass the kidney stone or no longer have symptoms        Allergies  Allergen Reactions   Tramadol     Muscle Spasms    Consultations: ENT   Procedures/Studies: CT Soft Tissue Neck W Contrast  Result Date: 04/09/2022 CLINICAL DATA:  42 year old male with increasing left neck and face abscess status post consultation by ENT and IV antibiotics but left AMA. Returns with increased swelling. EXAM: CT NECK WITH CONTRAST TECHNIQUE: Multidetector CT imaging of the neck was performed using the standard protocol following the bolus administration of intravenous contrast. RADIATION DOSE REDUCTION: This exam was performed according to the departmental dose-optimization program which includes automated exposure control, adjustment of the mA and/or kV according to patient size and/or use of iterative reconstruction technique. CONTRAST:  48mL OMNIPAQUE IOHEXOL 300 MG/ML  SOLN COMPARISON:  Neck CT 04/06/2022. FINDINGS: Pharynx and larynx: Progressive inflammation throughout the left parapharyngeal space now. Associated swelling along the left lateral pharynx. Other pharyngeal soft tissue contours remain normal. No pharyngeal mucosal space hyperenhancement. Right parapharyngeal space,  retropharyngeal space, and larynx remain within normal limits. Salivary glands: Bulky left submandibular and posterior sublingual space abscess along the undersurface of the posterior left mandible body (series 3, image 48) now encompasses about 33 x 25 by 46 mm (AP by transverse by CC) and appears mildly larger since 04/06/2022 (sagittal image 118 today). Estimated pus volume 19 mL. Multi spatial surrounding soft tissue swelling and stranding has increased, with new secondary inflammation of the left parotid space and increased left masticator space involvement. Mass effect and secondary inflammation affecting the left submandibular gland appears increased. There is mild rightward mass effect on the central sublingual space which otherwise appears to remain normal on series 3, image 48. Contralateral right submandibular and parotid glands remain normal. Thyroid: Inflammation tracks anterior to the thyroid in the midline lower neck now. Otherwise negative. Lymph nodes: Reactive left level 1 lymph nodes persist up to 9 mm short axis. Smaller reactive left level 2 and level 3 nodes. No lymph node suppuration identified. Vascular: Major vascular structures in the neck and at the skull base remain patent including the left IJ. Limited intracranial: Negative. Visualized orbits: Minimally included. Mastoids and visualized paranasal sinuses: Visualized paranasal sinuses and mastoids are stable and well aerated. Skeleton: Redemonstrated  cortical breakthrough from carious anterior residual left mandible molar associated periapical lucency, contiguous with the subperiosteal abscess on sagittal image 115. No other mandible cortical erosion. Adjacent wisdom tooth is present. No other No acute osseous abnormality identified. Upper chest: No superior mediastinal inflammation or lymphadenopathy. Mild respiratory motion, visible upper lungs remain clear. IMPRESSION: 1. Substantial progression of left face and neck Cellulitis  surrounding unresolved and mildly larger Left submandibular odontogenic Abscess (estimated 19 mL). Carious left mandible molar there with periapical dehiscent cortex. 2. Increased secondary inflammation throughout the soft tissue spaces of the left face and neck, relatively sparing the central sublingual space. Increased left submandibular gland mass effect and inflammation. Reactive regional lymph nodes. Electronically Signed   By: Genevie Ann M.D.   On: 04/09/2022 07:44   CT Soft Tissue Neck W Contrast  Result Date: 04/06/2022 CLINICAL DATA:  Dental pain EXAM: CT NECK WITH CONTRAST TECHNIQUE: Multidetector CT imaging of the neck was performed using the standard protocol following the bolus administration of intravenous contrast. RADIATION DOSE REDUCTION: This exam was performed according to the departmental dose-optimization program which includes automated exposure control, adjustment of the mA and/or kV according to patient size and/or use of iterative reconstruction technique. CONTRAST:  35mL OMNIPAQUE IOHEXOL 300 MG/ML  SOLN COMPARISON:  None Available. FINDINGS: Pharynx and larynx: There is an abscess at the left retromolar trigone measuring 2.1 x 2.5 cm. This arises from a periapical lucency of the left second mandibular molar (tooth 22). The left platysma is thickened and there is inflammatory change extending into the left face subcutaneous tissues and the left submandibular space. Salivary glands: Reactive enlargement of the left submandibular gland. Thyroid: Normal. Lymph nodes: Reactive left submandibular lymphadenopathy. Vascular: Negative. Limited intracranial: Negative. Visualized orbits: Negative. Mastoids and visualized paranasal sinuses: Clear. Skeleton: No acute or aggressive process. Upper chest: Negative. Other: None. IMPRESSION: A 2.1 x 2.5 cm abscess at the left retromolar trigone, arising from a periapical lucency of the left second mandibular molar (tooth 22). Electronically Signed   By:  Ulyses Jarred M.D.   On: 04/06/2022 21:32   DG Chest 2 View  Result Date: 04/06/2022 CLINICAL DATA:  Pneumonia EXAM: CHEST - 2 VIEW COMPARISON:  08/23/2021 FINDINGS: Lungs are well expanded, symmetric, and clear. No pneumothorax or pleural effusion. Cardiac size within normal limits. Pulmonary vascularity is normal. Remote healed left clavicle fracture. Osseous structures are age-appropriate. No acute bone abnormality. IMPRESSION: No active cardiopulmonary disease. Electronically Signed   By: Fidela Salisbury M.D.   On: 04/06/2022 19:31   (Echo, Carotid, EGD, Colonoscopy, ERCP)    Subjective: Pt c/o tooth pain intermittently    Discharge Exam: Vitals:   04/11/22 0523 04/11/22 0812  BP: 103/71 (!) 142/85  Pulse:  (!) 51  Resp: 20 18  Temp: 97.9 F (36.6 C) 97.7 F (36.5 C)  SpO2: 100% 100%   Vitals:   04/10/22 1912 04/10/22 1945 04/11/22 0523 04/11/22 0812  BP: (!) 140/85  103/71 (!) 142/85  Pulse: 61   (!) 51  Resp: 20  20 18   Temp: (!) 97.3 F (36.3 C)  97.9 F (36.6 C) 97.7 F (36.5 C)  TempSrc: Oral  Oral Oral  SpO2: 97% 97% 100% 100%  Weight:      Height:        General: Pt is alert, awake, not in acute distress Cardiovascular: S1/S2 +, no rubs, no gallops Respiratory: CTA bilaterally, no wheezing, no rhonchi Abdominal: Soft, NT, obese, bowel sounds + Extremities: no edema,  no cyanosis    The results of significant diagnostics from this hospitalization (including imaging, microbiology, ancillary and laboratory) are listed below for reference.     Microbiology: Recent Results (from the past 240 hour(s))  Culture, blood (Routine X 2) w Reflex to ID Panel     Status: None (Preliminary result)   Collection Time: 04/09/22  8:47 AM   Specimen: BLOOD  Result Value Ref Range Status   Specimen Description BLOOD  RIGHT [facility]  Final   Special Requests   Final    BOTTLES DRAWN AEROBIC AND ANAEROBIC Blood Culture results may not be optimal due to an excessive volume of blood  received in culture bottles   Culture  Setup Time ANAEROBIC BOTTLE ONLY  Final   Culture   Final    NO GROWTH 2 DAYS Performed at [facility], [address] [place], [place] [address]    Report Status PENDING  Incomplete  Culture, blood (Routine X 2) w Reflex to ID Panel     Status: None (Preliminary result)   Collection Time: 04/09/22  8:47 AM   Specimen: BLOOD  Result Value Ref Range Status   Specimen Description BLOOD LEFT AC  Final   Special Requests   Final    BOTTLES DRAWN AEROBIC AND ANAEROBIC Blood Culture results may not be optimal due to an excessive volume of blood received in culture bottles   Culture   Final    NO GROWTH 2 DAYS Performed at [facility], [address] [place], [place] [address]    Report Status PENDING  Incomplete  Aerobic Culture w Gram Stain (superficial specimen)     Status: None   Collection Time: 04/09/22 12:53 PM   Specimen: Wound; Abscess  Result Value Ref Range Status   Specimen Description   Final    ABSCESS Performed at [facility], [address] [place], [place] [address]    Special Requests   Final    LEFT NECK Performed at [facility], [address] [place], [place] [address]    Gram Stain NO WBC SEEN RARE GRAM NEGATIVE RODS   Final   Culture   Final    MODERATE HAEMOPHILUS PARAINFLUENZAE BETA LACTAMASE POSITIVE WITHIN MIXED NORMAL ORAL FLORA Performed at [facility], [place] [address] [place], [place] [address]    Report Status 04/11/2022 FINAL  Final     Labs: BNP (last 3 results) No results for input(s): "BNP" in the last 8760 hours. Basic Metabolic Panel: Recent Labs  Lab 04/06/22 1840 04/07/22 0500 04/09/22 0619 04/09/22 0847 04/10/22 0455 04/11/22 0539  NA 131* 136 133*  --  134* 139  K 3.9 4.4 3.4*  --  4.5 4.5  CL 98 102 100  --  102 101  CO2 22 26 26   --  25 25  GLUCOSE 108* 173* 130*  --  188* 158*  BUN 14 13 15   --  13 17  CREATININE 0.80 0.83 0.90   --  0.64 0.69  CALCIUM 9.2 8.9 8.7*  --  8.2* 8.9  MG  --   --   --  2.0  --   --    Liver Function Tests: Recent Labs  Lab 04/06/22 1840  AST 15  ALT 13  ALKPHOS 68  BILITOT 1.0  PROT 8.4*  ALBUMIN 4.2   No results for input(s): "LIPASE", "AMYLASE" in the last 168 hours. No results for input(s): "AMMONIA" in the last 168 hours. CBC:  Recent Labs  Lab 04/06/22 1840 04/07/22 0500 04/09/22 0619 04/10/22 0455 04/11/22 0539  WBC 14.1* 11.1* 18.0* 16.9* 21.1*  NEUTROABS 10.0*  --  13.9*  --   --   HGB 15.8 15.0 14.1 12.0* 12.9*  HCT 47.0 45.5 42.3 35.9* 38.5*  MCV 91.3 92.3 92.6 92.5 91.7  PLT 298 307 295 271 325   Cardiac Enzymes: No results for input(s): "CKTOTAL", "CKMB", "CKMBINDEX", "TROPONINI" in the last 168 hours. BNP: Invalid input(s): "POCBNP" CBG: Recent Labs  Lab 04/10/22 0813 04/11/22 0814  GLUCAP 146* 129*   D-Dimer No results for input(s): "DDIMER" in the last 72 hours. Hgb A1c No results for input(s): "HGBA1C" in the last 72 hours. Lipid Profile No results for input(s): "CHOL", "HDL", "LDLCALC", "TRIG", "CHOLHDL", "LDLDIRECT" in the last 72 hours. Thyroid function studies No results for input(s): "TSH", "T4TOTAL", "T3FREE", "THYROIDAB" in the last 72 hours.  Invalid input(s): "FREET3" Anemia work up No results for input(s): "VITAMINB12", "FOLATE", "FERRITIN", "TIBC", "IRON", "RETICCTPCT" in the last 72 hours. Urinalysis    Component Value Date/Time   COLORURINE YELLOW (A) 01/15/2018 0847   APPEARANCEUR CLEAR (A) 01/15/2018 0847   LABSPEC 1.013 01/15/2018 0847   PHURINE 5.0 01/15/2018 0847   GLUCOSEU NEGATIVE 01/15/2018 0847   HGBUR LARGE (A) 01/15/2018 0847   BILIRUBINUR NEGATIVE 01/15/2018 0847   KETONESUR NEGATIVE 01/15/2018 0847   PROTEINUR NEGATIVE 01/15/2018 0847   NITRITE NEGATIVE 01/15/2018 0847   LEUKOCYTESUR NEGATIVE 01/15/2018 0847   Sepsis Labs Recent Labs  Lab 04/07/22 0500 04/09/22 0619 04/10/22 0455 04/11/22 0539   WBC 11.1* 18.0* 16.9* 21.1*   Microbiology Recent Results (from the past 240 hour(s))  Culture, blood (Routine X 2) w Reflex to ID Panel     Status: None (Preliminary result)   Collection Time: 04/09/22  8:47 AM   Specimen: BLOOD  Result Value Ref Range Status   Specimen Description BLOOD  RIGHT [facility]  Final   Special Requests   Final    BOTTLES DRAWN AEROBIC AND ANAEROBIC Blood Culture results may not be optimal due to an excessive volume of blood received in culture bottles   Culture  Setup Time ANAEROBIC BOTTLE ONLY  Final   Culture   Final    NO GROWTH 2 DAYS Performed at [facility], [address] [place], [place] [address]    Report Status PENDING  Incomplete  Culture, blood (Routine X 2) w Reflex to ID Panel     Status: None (Preliminary result)   Collection Time: 04/09/22  8:47 AM   Specimen: BLOOD  Result Value Ref Range Status   Specimen Description BLOOD LEFT AC  Final   Special Requests   Final    BOTTLES DRAWN AEROBIC AND ANAEROBIC Blood Culture results may not be optimal due to an excessive volume of blood received in culture bottles   Culture   Final    NO GROWTH 2 DAYS Performed at [facility], [address], [place], [place] [address]    Report Status PENDING  Incomplete  Aerobic Culture w Gram Stain (superficial specimen)     Status: None   Collection Time: 04/09/22 12:53 PM   Specimen: Wound; Abscess  Result Value Ref Range Status   Specimen Description   Final    ABSCESS Performed at [facility], [address], [place],  [address]    Special Requests   Final    LEFT NECK Performed at [facility], [place], [place]  [id number]    Gram Stain NO WBC SEEN RARE GRAM NEGATIVE RODS   Final   Culture   Final    MODERATE HAEMOPHILUS PARAINFLUENZAE BETA LACTAMASE POSITIVE WITHIN MIXED NORMAL ORAL FLORA Performed at [facility], [place] [address] [place], [place] [address]    Report  Status 04/11/2022 FINAL  Final     Time coordinating discharge: Over 30 minutes  SIGNED:   Wyvonnia Dusky, MD  [facility] 04/11/2022, 1:19 PM Pager   If 7PM-7AM, please contact night-coverage [email or web address]

## 2022-04-14 LAB — CULTURE, BLOOD (ROUTINE X 2)
Culture: NO GROWTH
Culture: NO GROWTH

## 2022-04-29 SURGERY — Surgical Case
Anesthesia: *Unknown

## 2023-02-19 DIAGNOSIS — F17210 Nicotine dependence, cigarettes, uncomplicated: Secondary | ICD-10-CM | POA: Diagnosis not present

## 2023-02-19 DIAGNOSIS — Z885 Allergy status to narcotic agent status: Secondary | ICD-10-CM | POA: Diagnosis not present

## 2023-02-19 DIAGNOSIS — Z1152 Encounter for screening for COVID-19: Secondary | ICD-10-CM | POA: Diagnosis not present

## 2023-02-19 DIAGNOSIS — H1089 Other conjunctivitis: Secondary | ICD-10-CM | POA: Diagnosis not present

## 2023-02-19 DIAGNOSIS — H10503 Unspecified blepharoconjunctivitis, bilateral: Secondary | ICD-10-CM | POA: Diagnosis not present

## 2023-02-19 DIAGNOSIS — Z888 Allergy status to other drugs, medicaments and biological substances status: Secondary | ICD-10-CM | POA: Diagnosis not present

## 2023-02-19 DIAGNOSIS — H01009 Unspecified blepharitis unspecified eye, unspecified eyelid: Secondary | ICD-10-CM | POA: Diagnosis not present

## 2023-02-19 DIAGNOSIS — H5710 Ocular pain, unspecified eye: Secondary | ICD-10-CM | POA: Diagnosis not present
